# Patient Record
Sex: Female | Born: 1965 | Race: White | Hispanic: No | Marital: Single | Smoking: Former smoker
Health system: Southern US, Community
[De-identification: ages and names within clinical notes are randomized; demographics above are authoritative.]

## PROBLEM LIST (undated history)

## (undated) DIAGNOSIS — H9313 Tinnitus, bilateral: Secondary | ICD-10-CM

## (undated) DIAGNOSIS — M545 Low back pain, unspecified: Secondary | ICD-10-CM

## (undated) DIAGNOSIS — G8929 Other chronic pain: Secondary | ICD-10-CM

## (undated) DIAGNOSIS — R519 Headache, unspecified: Secondary | ICD-10-CM

## (undated) DIAGNOSIS — I499 Cardiac arrhythmia, unspecified: Secondary | ICD-10-CM

## (undated) DIAGNOSIS — T4145XA Adverse effect of unspecified anesthetic, initial encounter: Secondary | ICD-10-CM

## (undated) DIAGNOSIS — R51 Headache: Secondary | ICD-10-CM

## (undated) DIAGNOSIS — T8859XA Other complications of anesthesia, initial encounter: Secondary | ICD-10-CM

## (undated) DIAGNOSIS — G709 Myoneural disorder, unspecified: Secondary | ICD-10-CM

## (undated) DIAGNOSIS — N281 Cyst of kidney, acquired: Secondary | ICD-10-CM

## (undated) DIAGNOSIS — G039 Meningitis, unspecified: Secondary | ICD-10-CM

## (undated) HISTORY — PX: INGUINAL HERNIA REPAIR: SUR1180

## (undated) HISTORY — PX: ABDOMINAL HYSTERECTOMY: SHX81

## (undated) HISTORY — PX: EXCISIONAL HEMORRHOIDECTOMY: SHX1541

## (undated) HISTORY — PX: BACK SURGERY: SHX140

---

## 2010-03-14 HISTORY — PX: LUMBAR DISC SURGERY: SHX700

## 2010-03-14 HISTORY — PX: GANGLION CYST EXCISION: SHX1691

## 2010-03-14 HISTORY — PX: CARPAL TUNNEL RELEASE: SHX101

## 2012-05-14 ENCOUNTER — Other Ambulatory Visit: Payer: Self-pay | Admitting: Neurological Surgery

## 2012-05-14 ENCOUNTER — Other Ambulatory Visit (HOSPITAL_COMMUNITY): Payer: Self-pay | Admitting: Neurological Surgery

## 2012-05-14 DIAGNOSIS — M47817 Spondylosis without myelopathy or radiculopathy, lumbosacral region: Secondary | ICD-10-CM

## 2012-06-07 ENCOUNTER — Inpatient Hospital Stay (HOSPITAL_COMMUNITY): Admission: RE | Admit: 2012-06-07 | Payer: Self-pay | Source: Ambulatory Visit

## 2012-06-07 ENCOUNTER — Ambulatory Visit (HOSPITAL_COMMUNITY): Admission: RE | Admit: 2012-06-07 | Payer: PRIVATE HEALTH INSURANCE | Source: Ambulatory Visit

## 2012-06-28 ENCOUNTER — Ambulatory Visit (HOSPITAL_COMMUNITY): Payer: PRIVATE HEALTH INSURANCE

## 2012-06-28 ENCOUNTER — Other Ambulatory Visit (HOSPITAL_COMMUNITY): Payer: PRIVATE HEALTH INSURANCE

## 2014-08-25 ENCOUNTER — Other Ambulatory Visit: Payer: Self-pay | Admitting: Neurological Surgery

## 2014-09-29 ENCOUNTER — Encounter (HOSPITAL_COMMUNITY)
Admission: RE | Admit: 2014-09-29 | Discharge: 2014-09-29 | Disposition: A | Discharge status: Home / Self Care | Payer: Managed Care, Other (non HMO) | Source: Ambulatory Visit | Attending: Neurological Surgery | Admitting: Neurological Surgery

## 2014-09-29 ENCOUNTER — Encounter (HOSPITAL_COMMUNITY): Payer: Self-pay

## 2014-09-29 ENCOUNTER — Encounter (HOSPITAL_COMMUNITY): Payer: Self-pay | Admitting: Anesthesiology

## 2014-09-29 HISTORY — DX: Meningitis, unspecified: G03.9

## 2014-09-29 HISTORY — DX: Myoneural disorder, unspecified: G70.9

## 2014-09-29 HISTORY — DX: Tinnitus, bilateral: H93.13

## 2014-09-29 HISTORY — DX: Adverse effect of unspecified anesthetic, initial encounter: T41.45XA

## 2014-09-29 HISTORY — DX: Cardiac arrhythmia, unspecified: I49.9

## 2014-09-29 HISTORY — DX: Cyst of kidney, acquired: N28.1

## 2014-09-29 HISTORY — DX: Headache, unspecified: R51.9

## 2014-09-29 HISTORY — DX: Headache: R51

## 2014-09-29 HISTORY — DX: Other complications of anesthesia, initial encounter: T88.59XA

## 2014-09-29 LAB — ABO/RH: ABO/RH(D): AB POS

## 2014-09-29 LAB — BASIC METABOLIC PANEL
Anion gap: 11 (ref 5–15)
BUN: 19 mg/dL (ref 6–20)
CALCIUM: 8.9 mg/dL (ref 8.9–10.3)
CO2: 21 mmol/L — ABNORMAL LOW (ref 22–32)
Chloride: 106 mmol/L (ref 101–111)
Creatinine, Ser: 0.9 mg/dL (ref 0.44–1.00)
GFR calc non Af Amer: >60 mL/min (ref >60)
GLUCOSE: 98 mg/dL (ref 65–99)
Potassium: 3.6 mmol/L (ref 3.5–5.1)
Sodium: 138 mmol/L (ref 135–145)

## 2014-09-29 LAB — CBC
HEMATOCRIT: 38.2 % (ref 36.0–46.0)
Hemoglobin: 12.9 g/dL (ref 12.0–15.0)
MCH: 29.7 pg (ref 26.0–34.0)
MCHC: 33.8 g/dL (ref 30.0–36.0)
MCV: 88 fL (ref 78.0–100.0)
PLATELETS: 205 10*3/uL (ref 150–400)
RBC: 4.34 MIL/uL (ref 3.87–5.11)
RDW: 12.4 % (ref 11.5–15.5)
WBC: 8.5 10*3/uL (ref 4.0–10.5)

## 2014-09-29 LAB — SURGICAL PCR SCREEN
MRSA, PCR: NEGATIVE
STAPHYLOCOCCUS AUREUS: POSITIVE — AB

## 2014-09-29 LAB — TYPE AND SCREEN
ABO/RH(D): AB POS
Antibody Screen: NEGATIVE

## 2014-09-29 MED ORDER — CEFAZOLIN SODIUM-DEXTROSE 2-3 GM-% IV SOLR
2.0000 g | INTRAVENOUS | Status: AC
  Administered 2014-09-30 (×2): 2 g via INTRAVENOUS
  Filled 2014-09-29: qty 50

## 2014-09-29 NOTE — Progress Notes (Signed)
REQUESTED STRESS ECHO AND OFFICE NOTE FROM [facility].

## 2014-09-29 NOTE — Pre-Procedure Instructions (Signed)
[facility] [place] [facility]  09/29/2014      [name] [name] [facility] [address] Deborah Bates, Deborah Bates - [address] AT [facility] [address] Deborah Bates [place] [address] Phone: [phone] Fax: [phone]    Your procedure is scheduled on   Tuesday  09/30/14  Report to [facility] Admitting at  715 A.M.  Call this number if you have problems the morning of Bates:  [phone]   Remember:  Do not eat food or drink liquids after midnight.  Take these medicines the morning of Bates with A SIP OF WATER  CLONAZEPAM, DULOXETINE (CYMBALTA), OXYCODONE OR TRAMADOL IF NEEDED INHALER, TOPAMAX,    Do not wear jewelry, make-up or nail polish.  Do not wear lotions, powders, or perfumes.  You may wear deodorant.  Do not shave 48 hours prior to Bates.  Men may shave face and neck.  Do not bring valuables to the hospital.  [facility] is not responsible for any belongings or valuables.  Contacts, dentures or bridgework may not be worn into Bates.  Leave your suitcase in the car.  After Bates it may be brought to your room.  For patients admitted to the hospital, discharge time will be determined by your treatment team.  Patients discharged the day of Bates will not be allowed to drive home.   Name and phone number of your driver:    Special instructions:  [facility]  Before Bates, you can play an important role.  Because skin is not sterile, your skin needs to be as free of germs as possible.  You can reduce the number of germs on you skin by washing with CHG (chlorahexidine gluconate) soap before Bates.  CHG is an antiseptic cleaner which kills germs and bonds with the skin to continue killing germs even after washing.  Please DO NOT use if you have an allergy to CHG or antibacterial soaps.  If your skin becomes reddened/irritated stop using the CHG and inform your nurse when you arrive at Short Stay.  Do not shave  (including legs and underarms) for at least 48 hours prior to the first CHG shower.  You may shave your face.  Please follow these instructions carefully:   1.  Shower with CHG Soap the night before Bates and the                                morning of Bates.  2.  If you choose to wash your hair, wash your hair first as usual with your       normal shampoo.  3.  After you shampoo, rinse your hair and body thoroughly to remove the                      Shampoo.  4.  Use CHG as you would any other liquid soap.  You can apply chg directly       to the skin and wash gently with scrungie or a clean washcloth.  5.  Apply the CHG Soap to your body ONLY FROM THE NECK DOWN.        Do not use on open wounds or open sores.  Avoid contact with your eyes,       ears, mouth and genitals (private parts).  Wash genitals (private parts)       with your normal soap.  6.  Wash thoroughly, paying special attention to the area where your Bates        will be performed.  7.  Thoroughly rinse your body with warm water from the neck down.  8.  DO NOT shower/wash with your normal soap after using and rinsing off       the CHG Soap.  9.  Pat yourself dry with a clean towel.            10.  Wear clean pajamas.            11.  Place clean sheets on your bed the night of your first shower and do not        sleep with pets.  Day of Surgery  Do not apply any lotions/deoderants the morning of Bates.  Please wear clean clothes to the hospital/Bates Bates.     Please read over the following fact sheets that you were given. Pain Booklet, Coughing and Deep Breathing, Blood Transfusion Information, MRSA Information and Surgical Site Infection Prevention

## 2014-09-30 ENCOUNTER — Inpatient Hospital Stay (HOSPITAL_COMMUNITY): Payer: Managed Care, Other (non HMO)

## 2014-09-30 ENCOUNTER — Inpatient Hospital Stay (HOSPITAL_COMMUNITY)
Admission: RE | Admit: 2014-09-30 | Discharge: 2014-10-02 | DRG: 460 | Disposition: A | Discharge status: Home / Self Care | Payer: Managed Care, Other (non HMO) | Source: Ambulatory Visit | Attending: Neurological Surgery | Admitting: Neurological Surgery

## 2014-09-30 ENCOUNTER — Encounter (HOSPITAL_COMMUNITY)
Admission: RE | Disposition: A | Discharge status: Home / Self Care | Payer: PRIVATE HEALTH INSURANCE | Source: Ambulatory Visit | Attending: Neurological Surgery

## 2014-09-30 ENCOUNTER — Inpatient Hospital Stay (HOSPITAL_COMMUNITY): Payer: Managed Care, Other (non HMO) | Admitting: Anesthesiology

## 2014-09-30 ENCOUNTER — Encounter (HOSPITAL_COMMUNITY): Payer: Self-pay | Admitting: *Deleted

## 2014-09-30 DIAGNOSIS — M4726 Other spondylosis with radiculopathy, lumbar region: Secondary | ICD-10-CM | POA: Diagnosis present

## 2014-09-30 DIAGNOSIS — Z87891 Personal history of nicotine dependence: Secondary | ICD-10-CM

## 2014-09-30 DIAGNOSIS — M4806 Spinal stenosis, lumbar region: Secondary | ICD-10-CM | POA: Diagnosis present

## 2014-09-30 DIAGNOSIS — M48061 Spinal stenosis, lumbar region without neurogenic claudication: Secondary | ICD-10-CM | POA: Diagnosis present

## 2014-09-30 DIAGNOSIS — M4180 Other forms of scoliosis, site unspecified: Secondary | ICD-10-CM | POA: Diagnosis present

## 2014-09-30 DIAGNOSIS — Z419 Encounter for procedure for purposes other than remedying health state, unspecified: Secondary | ICD-10-CM

## 2014-09-30 DIAGNOSIS — Z01812 Encounter for preprocedural laboratory examination: Secondary | ICD-10-CM | POA: Diagnosis not present

## 2014-09-30 DIAGNOSIS — R51 Headache: Secondary | ICD-10-CM | POA: Diagnosis present

## 2014-09-30 DIAGNOSIS — Z885 Allergy status to narcotic agent status: Secondary | ICD-10-CM | POA: Diagnosis not present

## 2014-09-30 DIAGNOSIS — Z79899 Other long term (current) drug therapy: Secondary | ICD-10-CM

## 2014-09-30 HISTORY — DX: Low back pain: M54.5

## 2014-09-30 HISTORY — DX: Other chronic pain: G89.29

## 2014-09-30 HISTORY — DX: Low back pain, unspecified: M54.50

## 2014-09-30 HISTORY — PX: POSTERIOR LUMBAR FUSION: SHX6036

## 2014-09-30 SURGERY — POSTERIOR LUMBAR FUSION 2 LEVEL
Anesthesia: General | Site: Back

## 2014-09-30 MED ORDER — METHOCARBAMOL 500 MG PO TABS
500.0000 mg | ORAL_TABLET | Freq: Four times a day (QID) | ORAL | Status: DC | PRN
  Filled 2014-09-30: qty 1

## 2014-09-30 MED ORDER — TOPIRAMATE 25 MG PO TABS
50.0000 mg | ORAL_TABLET | Freq: Two times a day (BID) | ORAL | Status: DC
  Administered 2014-09-30 – 2014-10-01 (×3): 50 mg via ORAL
  Filled 2014-09-30 (×3): qty 2

## 2014-09-30 MED ORDER — ONDANSETRON HCL 4 MG/2ML IJ SOLN
4.0000 mg | INTRAMUSCULAR | Status: DC | PRN
  Administered 2014-10-01: 4 mg via INTRAVENOUS
  Filled 2014-09-30: qty 2

## 2014-09-30 MED ORDER — PHENOL 1.4 % MT LIQD: 1.0000 | OROMUCOSAL | Status: DC | PRN

## 2014-09-30 MED ORDER — ALBUTEROL SULFATE (2.5 MG/3ML) 0.083% IN NEBU: 2.5000 mg | INHALATION_SOLUTION | RESPIRATORY_TRACT | Status: DC | PRN

## 2014-09-30 MED ORDER — PROMETHAZINE HCL 25 MG/ML IJ SOLN: 6.2500 mg | INTRAMUSCULAR | Status: DC | PRN

## 2014-09-30 MED ORDER — SODIUM CHLORIDE 0.9 % IV SOLN
INTRAVENOUS | Status: DC
  Administered 2014-10-01 – 2014-10-02 (×3): via INTRAVENOUS

## 2014-09-30 MED ORDER — OXYCODONE-ACETAMINOPHEN 5-325 MG PO TABS
1.0000 | ORAL_TABLET | ORAL | Status: DC | PRN
  Administered 2014-10-02: 1 via ORAL
  Filled 2014-09-30: qty 1

## 2014-09-30 MED ORDER — TRAMADOL HCL 50 MG PO TABS: 50.0000 mg | ORAL_TABLET | Freq: Four times a day (QID) | ORAL | Status: DC

## 2014-09-30 MED ORDER — KETAMINE HCL 10 MG/ML IJ SOLN
INTRAMUSCULAR | Status: DC | PRN
  Administered 2014-09-30: 35 mg via INTRAVENOUS

## 2014-09-30 MED ORDER — DEXAMETHASONE 2 MG PO TABS
2.0000 mg | ORAL_TABLET | Freq: Two times a day (BID) | ORAL | Status: DC
  Filled 2014-09-30 (×3): qty 1

## 2014-09-30 MED ORDER — ARTIFICIAL TEARS OP OINT
TOPICAL_OINTMENT | OPHTHALMIC | Status: DC | PRN
  Administered 2014-09-30: 1 via OPHTHALMIC

## 2014-09-30 MED ORDER — CYCLOBENZAPRINE HCL 10 MG PO TABS
10.0000 mg | ORAL_TABLET | Freq: Three times a day (TID) | ORAL | Status: DC | PRN
  Administered 2014-09-30 – 2014-10-02 (×5): 10 mg via ORAL
  Filled 2014-09-30 (×5): qty 1

## 2014-09-30 MED ORDER — SODIUM CHLORIDE 0.9 % IV SOLN
250.0000 mg | INTRAVENOUS | Status: DC | PRN
  Administered 2014-09-30: 1 ug/kg/min via INTRAVENOUS

## 2014-09-30 MED ORDER — LIDOCAINE-EPINEPHRINE 1 %-1:100000 IJ SOLN
INTRAMUSCULAR | Status: DC | PRN
  Administered 2014-09-30: 5 mL

## 2014-09-30 MED ORDER — LACTATED RINGERS IV SOLN
INTRAVENOUS | Status: DC
  Administered 2014-09-30 (×2): via INTRAVENOUS

## 2014-09-30 MED ORDER — ACETAMINOPHEN 325 MG PO TABS
650.0000 mg | ORAL_TABLET | ORAL | Status: DC | PRN
  Administered 2014-10-01 – 2014-10-02 (×2): 650 mg via ORAL
  Filled 2014-09-30 (×2): qty 2

## 2014-09-30 MED ORDER — BUPIVACAINE HCL (PF) 0.5 % IJ SOLN
INTRAMUSCULAR | Status: DC | PRN
  Administered 2014-09-30: 25 mL
  Administered 2014-09-30: 5 mL

## 2014-09-30 MED ORDER — TOPIRAMATE 25 MG PO TABS: 50.0000 mg | ORAL_TABLET | Freq: Three times a day (TID) | ORAL | Status: DC

## 2014-09-30 MED ORDER — SODIUM CHLORIDE 0.9 % IV SOLN: 250.0000 mL | INTRAVENOUS | Status: DC

## 2014-09-30 MED ORDER — FENTANYL CITRATE (PF) 100 MCG/2ML IJ SOLN
25.0000 ug | INTRAMUSCULAR | Status: DC | PRN
  Administered 2014-09-30 (×3): 50 ug via INTRAVENOUS

## 2014-09-30 MED ORDER — 0.9 % SODIUM CHLORIDE (POUR BTL) OPTIME
TOPICAL | Status: DC | PRN
  Administered 2014-09-30: 1000 mL

## 2014-09-30 MED ORDER — CEFAZOLIN SODIUM 1-5 GM-% IV SOLN
1.0000 g | Freq: Three times a day (TID) | INTRAVENOUS | Status: AC
  Administered 2014-09-30 – 2014-10-01 (×2): 1 g via INTRAVENOUS
  Filled 2014-09-30 (×2): qty 50

## 2014-09-30 MED ORDER — OXYCODONE HCL 5 MG PO TABS
15.0000 mg | ORAL_TABLET | ORAL | Status: DC | PRN
  Administered 2014-09-30 – 2014-10-02 (×8): 15 mg via ORAL
  Filled 2014-09-30 (×8): qty 3

## 2014-09-30 MED ORDER — ROCURONIUM BROMIDE 100 MG/10ML IV SOLN
INTRAVENOUS | Status: DC | PRN
  Administered 2014-09-30: 50 mg via INTRAVENOUS

## 2014-09-30 MED ORDER — SODIUM CHLORIDE 0.9 % IR SOLN
Status: DC | PRN
  Administered 2014-09-30: 500 mL

## 2014-09-30 MED ORDER — FENTANYL CITRATE (PF) 100 MCG/2ML IJ SOLN
INTRAMUSCULAR | Status: DC | PRN
  Administered 2014-09-30: 50 ug via INTRAVENOUS
  Administered 2014-09-30: 150 ug via INTRAVENOUS
  Administered 2014-09-30: 50 ug via INTRAVENOUS

## 2014-09-30 MED ORDER — CLONAZEPAM 0.5 MG PO TABS
0.5000 mg | ORAL_TABLET | Freq: Two times a day (BID) | ORAL | Status: DC
  Administered 2014-09-30 – 2014-10-01 (×3): 0.5 mg via ORAL
  Filled 2014-09-30 (×3): qty 1

## 2014-09-30 MED ORDER — FENTANYL CITRATE (PF) 100 MCG/2ML IJ SOLN
INTRAMUSCULAR | Status: AC
  Filled 2014-09-30: qty 2

## 2014-09-30 MED ORDER — PROPOFOL 10 MG/ML IV BOLUS
INTRAVENOUS | Status: AC
  Filled 2014-09-30: qty 20

## 2014-09-30 MED ORDER — DULOXETINE HCL 30 MG PO CPEP
30.0000 mg | ORAL_CAPSULE | Freq: Every morning | ORAL | Status: DC
  Administered 2014-10-01: 30 mg via ORAL
  Filled 2014-09-30: qty 1

## 2014-09-30 MED ORDER — NEOSTIGMINE METHYLSULFATE 10 MG/10ML IV SOLN
INTRAVENOUS | Status: DC | PRN
  Administered 2014-09-30: 4 mg via INTRAVENOUS

## 2014-09-30 MED ORDER — LACTATED RINGERS IV SOLN
INTRAVENOUS | Status: DC | PRN
  Administered 2014-09-30 (×3): via INTRAVENOUS

## 2014-09-30 MED ORDER — MIDAZOLAM HCL 5 MG/5ML IJ SOLN
INTRAMUSCULAR | Status: DC | PRN
  Administered 2014-09-30: 2 mg via INTRAVENOUS

## 2014-09-30 MED ORDER — MUPIROCIN 2 % EX OINT
1.0000 "application " | TOPICAL_OINTMENT | Freq: Once | CUTANEOUS | Status: AC
  Administered 2014-09-30: 1 via TOPICAL

## 2014-09-30 MED ORDER — FENTANYL CITRATE (PF) 250 MCG/5ML IJ SOLN
INTRAMUSCULAR | Status: AC
  Filled 2014-09-30: qty 5

## 2014-09-30 MED ORDER — KETOROLAC TROMETHAMINE 15 MG/ML IJ SOLN
INTRAMUSCULAR | Status: AC
  Filled 2014-09-30: qty 1

## 2014-09-30 MED ORDER — LIDOCAINE HCL (CARDIAC) 20 MG/ML IV SOLN
INTRAVENOUS | Status: DC | PRN
  Administered 2014-09-30: 50 mg via INTRAVENOUS

## 2014-09-30 MED ORDER — KETOROLAC TROMETHAMINE 15 MG/ML IJ SOLN
15.0000 mg | Freq: Four times a day (QID) | INTRAMUSCULAR | Status: AC
  Administered 2014-09-30 – 2014-10-01 (×5): 15 mg via INTRAVENOUS
  Filled 2014-09-30 (×4): qty 1

## 2014-09-30 MED ORDER — SODIUM CHLORIDE 0.9 % IJ SOLN: 3.0000 mL | INTRAMUSCULAR | Status: DC | PRN

## 2014-09-30 MED ORDER — SUGAMMADEX SODIUM 200 MG/2ML IV SOLN
INTRAVENOUS | Status: AC
  Filled 2014-09-30: qty 2

## 2014-09-30 MED ORDER — MENTHOL 3 MG MT LOZG: 1.0000 | LOZENGE | OROMUCOSAL | Status: DC | PRN

## 2014-09-30 MED ORDER — THROMBIN 20000 UNITS EX SOLR
CUTANEOUS | Status: DC | PRN
  Administered 2014-09-30: 20 mL via TOPICAL

## 2014-09-30 MED ORDER — MUPIROCIN 2 % EX OINT
TOPICAL_OINTMENT | CUTANEOUS | Status: AC
  Filled 2014-09-30: qty 22

## 2014-09-30 MED ORDER — BISACODYL 10 MG RE SUPP
10.0000 mg | Freq: Every day | RECTAL | Status: DC | PRN
  Administered 2014-10-01: 10 mg via RECTAL
  Filled 2014-09-30: qty 1

## 2014-09-30 MED ORDER — SODIUM CHLORIDE 0.9 % IJ SOLN
3.0000 mL | Freq: Two times a day (BID) | INTRAMUSCULAR | Status: DC
  Administered 2014-09-30: 3 mL via INTRAVENOUS

## 2014-09-30 MED ORDER — PROPOFOL 10 MG/ML IV BOLUS
INTRAVENOUS | Status: DC | PRN
  Administered 2014-09-30: 150 mg via INTRAVENOUS

## 2014-09-30 MED ORDER — ALBUTEROL SULFATE HFA 108 (90 BASE) MCG/ACT IN AERS: 2.0000 | INHALATION_SPRAY | RESPIRATORY_TRACT | Status: DC | PRN

## 2014-09-30 MED ORDER — TOPIRAMATE 100 MG PO TABS
100.0000 mg | ORAL_TABLET | Freq: Every day | ORAL | Status: DC
  Administered 2014-09-30 – 2014-10-01 (×2): 100 mg via ORAL
  Filled 2014-09-30 (×2): qty 1

## 2014-09-30 MED ORDER — ONDANSETRON HCL 4 MG/2ML IJ SOLN
INTRAMUSCULAR | Status: DC | PRN
  Administered 2014-09-30: 4 mg via INTRAVENOUS

## 2014-09-30 MED ORDER — ALUM & MAG HYDROXIDE-SIMETH 200-200-20 MG/5ML PO SUSP
30.0000 mL | Freq: Four times a day (QID) | ORAL | Status: DC | PRN
Start: 2014-09-30 — End: 2014-10-02

## 2014-09-30 MED ORDER — SENNA 8.6 MG PO TABS
1.0000 | ORAL_TABLET | Freq: Two times a day (BID) | ORAL | Status: DC
  Administered 2014-09-30 – 2014-10-01 (×3): 8.6 mg via ORAL
  Filled 2014-09-30 (×3): qty 1

## 2014-09-30 MED ORDER — KETAMINE HCL 100 MG/ML IJ SOLN
INTRAMUSCULAR | Status: AC
  Filled 2014-09-30: qty 1

## 2014-09-30 MED ORDER — METHOCARBAMOL 1000 MG/10ML IJ SOLN
500.0000 mg | Freq: Four times a day (QID) | INTRAVENOUS | Status: DC | PRN
  Administered 2014-09-30: 500 mg via INTRAVENOUS
  Filled 2014-09-30 (×2): qty 5

## 2014-09-30 MED ORDER — FLEET ENEMA 7-19 GM/118ML RE ENEM: 1.0000 | ENEMA | Freq: Once | RECTAL | Status: AC | PRN

## 2014-09-30 MED ORDER — GLYCOPYRROLATE 0.2 MG/ML IJ SOLN
INTRAMUSCULAR | Status: DC | PRN
  Administered 2014-09-30: .7 mg via INTRAVENOUS

## 2014-09-30 MED ORDER — ACETAMINOPHEN 650 MG RE SUPP: 650.0000 mg | RECTAL | Status: DC | PRN

## 2014-09-30 MED ORDER — PHENYLEPHRINE HCL 10 MG/ML IJ SOLN
INTRAMUSCULAR | Status: DC | PRN
  Administered 2014-09-30: 80 ug via INTRAVENOUS

## 2014-09-30 MED ORDER — ZOLPIDEM TARTRATE 5 MG PO TABS
5.0000 mg | ORAL_TABLET | Freq: Every evening | ORAL | Status: DC | PRN
  Administered 2014-09-30 – 2014-10-01 (×2): 5 mg via ORAL
  Filled 2014-09-30 (×2): qty 1

## 2014-09-30 MED ORDER — MIRTAZAPINE 15 MG PO TABS
15.0000 mg | ORAL_TABLET | Freq: Every day | ORAL | Status: DC
  Administered 2014-09-30 – 2014-10-01 (×2): 15 mg via ORAL
  Filled 2014-09-30 (×2): qty 1

## 2014-09-30 MED ORDER — MIDAZOLAM HCL 2 MG/2ML IJ SOLN
INTRAMUSCULAR | Status: AC
  Filled 2014-09-30: qty 2

## 2014-09-30 MED ORDER — DOCUSATE SODIUM 100 MG PO CAPS
100.0000 mg | ORAL_CAPSULE | Freq: Two times a day (BID) | ORAL | Status: DC
  Administered 2014-09-30 – 2014-10-01 (×3): 100 mg via ORAL
  Filled 2014-09-30 (×3): qty 1

## 2014-09-30 MED ORDER — POLYETHYLENE GLYCOL 3350 17 G PO PACK
17.0000 g | PACK | Freq: Every day | ORAL | Status: DC | PRN
  Administered 2014-10-01: 17 g via ORAL
  Filled 2014-09-30: qty 1

## 2014-09-30 MED ORDER — VECURONIUM BROMIDE 10 MG IV SOLR
INTRAVENOUS | Status: DC | PRN
  Administered 2014-09-30 (×3): 2 mg via INTRAVENOUS

## 2014-09-30 SURGICAL SUPPLY — 60 items
BAG DECANTER FOR FLEXI CONT (MISCELLANEOUS) ×2 IMPLANT
BLADE CLIPPER SURG (BLADE) IMPLANT
BONE MATRIX OSTEOCEL PRO MED (Bone Implant) ×4 IMPLANT
BUR MATCHSTICK NEURO 3.0 LAGG (BURR) ×2 IMPLANT
CAGE COROENT MP 8X9X23M-8 SPIN (Cage) ×4 IMPLANT
CAGE COROENT PLIF 10X28-8 LUMB (Cage) ×4 IMPLANT
CANISTER SUCT 3000ML PPV (MISCELLANEOUS) ×2 IMPLANT
CONT SPEC 4OZ CLIKSEAL STRL BL (MISCELLANEOUS) ×4 IMPLANT
COVER BACK TABLE 60X90IN (DRAPES) ×2 IMPLANT
DECANTER SPIKE VIAL GLASS SM (MISCELLANEOUS) ×2 IMPLANT
DERMABOND ADVANCED (GAUZE/BANDAGES/DRESSINGS) ×1
DERMABOND ADVANCED .7 DNX12 (GAUZE/BANDAGES/DRESSINGS) ×1 IMPLANT
DRAPE C-ARM 42X72 X-RAY (DRAPES) ×4 IMPLANT
DRAPE LAPAROTOMY 100X72X124 (DRAPES) ×2 IMPLANT
DRAPE POUCH INSTRU U-SHP 10X18 (DRAPES) ×2 IMPLANT
DRAPE PROXIMA HALF (DRAPES) IMPLANT
DRSG OPSITE POSTOP 4X6 (GAUZE/BANDAGES/DRESSINGS) ×2 IMPLANT
DURAPREP 26ML APPLICATOR (WOUND CARE) ×2 IMPLANT
ELECT REM PT RETURN 9FT ADLT (ELECTROSURGICAL) ×2
ELECTRODE REM PT RTRN 9FT ADLT (ELECTROSURGICAL) ×1 IMPLANT
GAUZE SPONGE 4X4 12PLY STRL (GAUZE/BANDAGES/DRESSINGS) IMPLANT
GAUZE SPONGE 4X4 16PLY XRAY LF (GAUZE/BANDAGES/DRESSINGS) ×2 IMPLANT
GLOVE BIO SURGEON STRL SZ7.5 (GLOVE) ×4 IMPLANT
GLOVE BIOGEL PI IND STRL 7.5 (GLOVE) ×2 IMPLANT
GLOVE BIOGEL PI IND STRL 8.5 (GLOVE) ×2 IMPLANT
GLOVE BIOGEL PI INDICATOR 7.5 (GLOVE) ×2
GLOVE BIOGEL PI INDICATOR 8.5 (GLOVE) ×2
GLOVE ECLIPSE 8.5 STRL (GLOVE) ×4 IMPLANT
GOWN STRL REUS W/ TWL LRG LVL3 (GOWN DISPOSABLE) ×1 IMPLANT
GOWN STRL REUS W/ TWL XL LVL3 (GOWN DISPOSABLE) ×2 IMPLANT
GOWN STRL REUS W/TWL 2XL LVL3 (GOWN DISPOSABLE) ×2 IMPLANT
GOWN STRL REUS W/TWL LRG LVL3 (GOWN DISPOSABLE) ×1
GOWN STRL REUS W/TWL XL LVL3 (GOWN DISPOSABLE) ×2
HEMOSTAT POWDER KIT SURGIFOAM (HEMOSTASIS) IMPLANT
KIT BASIN OR (CUSTOM PROCEDURE TRAY) ×2 IMPLANT
KIT ROOM TURNOVER OR (KITS) ×2 IMPLANT
MILL MEDIUM DISP (BLADE) ×2 IMPLANT
NEEDLE HYPO 22GX1.5 SAFETY (NEEDLE) ×2 IMPLANT
NEEDLE SPNL 18GX3.5 QUINCKE PK (NEEDLE) IMPLANT
NS IRRIG 1000ML POUR BTL (IV SOLUTION) ×2 IMPLANT
PACK LAMINECTOMY NEURO (CUSTOM PROCEDURE TRAY) ×2 IMPLANT
PAD ARMBOARD 7.5X6 YLW CONV (MISCELLANEOUS) ×6 IMPLANT
PATTIES SURGICAL .5 X1 (DISPOSABLE) IMPLANT
ROD RELIN-O LORD 5.5X65MM (Rod) ×4 IMPLANT
SCREW LOCK RELINE 5.5 TULIP (Screw) ×12 IMPLANT
SCREW RELINE-O POLY 6.5X45 (Screw) ×12 IMPLANT
SPONGE LAP 4X18 X RAY DECT (DISPOSABLE) IMPLANT
SPONGE SURGIFOAM ABS GEL 100 (HEMOSTASIS) ×2 IMPLANT
SUT VIC AB 1 CT1 18XBRD ANBCTR (SUTURE) ×2 IMPLANT
SUT VIC AB 1 CT1 8-18 (SUTURE) ×2
SUT VIC AB 2-0 CP2 18 (SUTURE) ×4 IMPLANT
SUT VIC AB 3-0 SH 8-18 (SUTURE) ×4 IMPLANT
SYR 20ML ECCENTRIC (SYRINGE) ×2 IMPLANT
SYR 3ML LL SCALE MARK (SYRINGE) ×8 IMPLANT
SYR 5ML LL (SYRINGE) IMPLANT
TOWEL OR 17X24 6PK STRL BLUE (TOWEL DISPOSABLE) ×2 IMPLANT
TOWEL OR 17X26 10 PK STRL BLUE (TOWEL DISPOSABLE) ×2 IMPLANT
TRAP SPECIMEN MUCOUS 40CC (MISCELLANEOUS) ×2 IMPLANT
TRAY FOLEY W/METER SILVER 14FR (SET/KITS/TRAYS/PACK) ×2 IMPLANT
WATER STERILE IRR 1000ML POUR (IV SOLUTION) ×2 IMPLANT

## 2014-09-30 NOTE — Transfer of Care (Signed)
Immediate Anesthesia Transfer of Care Note  Patient: Deborah Bates  Procedure(s) Performed: Procedure(s) with comments: L3-4 L4-5 Posterior lumbar interbody fusion (N/A) - L3-4 L4-5 Posterior lumbar interbody fusion  Patient Location: PACU  Anesthesia Type:General  Level of Consciousness: awake, alert  and oriented  Airway & Oxygen Therapy: Patient Spontanous Breathing and Patient connected to nasal cannula oxygen  Post-op Assessment: Report given to RN and Post -op Vital signs reviewed and stable  Post vital signs: Reviewed and stable  Last Vitals:  Filed Vitals:   09/30/14 1446  BP:   Pulse:   Temp: 36.5 C  Resp:     Complications: No apparent anesthesia complications

## 2014-09-30 NOTE — Progress Notes (Signed)
Nothing received as of 0800  09-30-2014. From [facility] (Echo Stress)

## 2014-09-30 NOTE — H&P (Signed)
Deborah Bates is an 49 y.o. female.   Chief Complaint: Back and bilateral leg pain HPI: Deborah Bates is a 49 year old female who has had previous problems with back pain and lumbar radiculopathy. 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 She notes that she has been unable to control her pain with changes in position alteration of activity. 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  Past Medical History  Diagnosis Date  . Complication of anesthesia     TROUBLE COMING OUT OF IT   . Dysrhythmia     TACHYCARDIA   . Headache   . Tinnitus of both ears   . Neuromuscular disorder     NUMBNESS TINGLING HAND/FEET  . Cyst of kidney, acquired   . Meningitis     1984    Past Surgical History  Procedure Laterality Date  . Hemorroidectomy    . Abdominal hysterectomy    . Back surgery      2012   LUMBAR  DEC    . Carpal tunnel release      2012  . Hernia repair      BIL HERNIA   AS INFANT    History reviewed. No pertinent family history. Social History:  reports that she quit smoking 5 days ago. She does not have any smokeless tobacco history on file. She reports that she drinks alcohol. She reports that she does not use illicit drugs.  Allergies:  Allergies  Allergen Reactions  . Dilaudid [Hydromorphone Hcl]     MAKES ME CRAZY    I CLAW MY SKIN OFF   . Morphine And Related Other (See Comments)    Pt states it "makes me crazy and makes me feel like I want to scratch my skin off"  . Vicodin [Hydrocodone-Acetaminophen] Rash    Medications Prior to Admission   Medication Sig Dispense Refill  . clonazePAM (KLONOPIN) 0.5 MG tablet Take 0.5 mg by mouth 2 (two) times daily.  1  . cyclobenzaprine (FLEXERIL) 10 MG tablet Take 10 mg by mouth 3 (three) times daily as needed for muscle spasms.   2  . DULoxetine (CYMBALTA) 30 MG capsule Take 30 mg by mouth every morning.  1  . eszopiclone (LUNESTA) 2 MG TABS tablet Take 2 mg by mouth at bedtime.  3  . mirtazapine (REMERON) 15 MG tablet Take 15 mg by mouth at bedtime.  2  . oxyCODONE (ROXICODONE) 15 MG immediate release tablet Take 7.5 mg by mouth daily as needed for pain.   0  . topiramate (TOPAMAX) 100 MG tablet Take 50-100 mg by mouth 3 (three) times daily. 50 mg twice daily and 100 mg at bedtime  4  . valACYclovir (VALTREX) 1000 MG tablet Take 1 g by mouth as needed (cold sores).   5  . varenicline (CHANTIX) 1 MG tablet Take 1 mg by mouth 2 (two) times daily.    Marland Kitchen PROVENTIL HFA 108 (90 BASE) MCG/ACT inhaler Take 2 puffs by mouth every 4 (four) hours as needed for wheezing.   1  . traMADol (ULTRAM) 50 MG tablet Take 50 mg by mouth  4 (four) times daily.  2    Results for orders placed or performed during the hospital encounter of 09/29/14 (from the past 48 hour(s))  Type and screen     Status: None   Collection Time: 09/29/14  4:10 PM  Result Value Ref Range   ABO/RH(D) AB POS    Antibody Screen NEG    Sample Expiration 10/13/2014   ABO/Rh     Status: None   Collection Time: 09/29/14  4:10 PM  Result Value Ref Range   ABO/RH(D) AB POS   Surgical pcr screen     Status: Abnormal   Collection Time: 09/29/14  4:11 PM  Result Value Ref Range   MRSA, PCR NEGATIVE NEGATIVE   Staphylococcus aureus POSITIVE (A) NEGATIVE    Comment:        The Xpert SA Assay (FDA approved for NASAL specimens in patients over 32 years of age), is one component of a comprehensive surveillance program.  Test performance has been validated by [facility] for patients greater than or equal to 56 year old. It is not  intended to diagnose infection nor to guide or monitor treatment.   CBC     Status: None   Collection Time: 09/29/14  4:12 PM  Result Value Ref Range   WBC 8.5 4.0 - 10.5 K/uL   RBC 4.34 3.87 - 5.11 MIL/uL   Hemoglobin 12.9 12.0 - 15.0 g/dL   HCT 38.2 36.0 - 46.0 %   MCV 88.0 78.0 - 100.0 fL   MCH 29.7 26.0 - 34.0 pg   MCHC 33.8 30.0 - 36.0 g/dL   RDW 12.4 11.5 - 15.5 %   Platelets 205 150 - 400 K/uL  Basic metabolic panel     Status: Abnormal   Collection Time: 09/29/14  4:12 PM  Result Value Ref Range   Sodium 138 135 - 145 mmol/L   Potassium 3.6 3.5 - 5.1 mmol/L   Chloride 106 101 - 111 mmol/L   CO2 21 (L) 22 - 32 mmol/L   Glucose, Bld 98 65 - 99 mg/dL   BUN 19 6 - 20 mg/dL   Creatinine, Ser 0.90 0.44 - 1.00 mg/dL   Calcium 8.9 8.9 - 10.3 mg/dL   GFR calc non Af Amer >60 >60 mL/min   GFR calc Af Amer >60 >60 mL/min    Comment: (NOTE) The eGFR has been calculated using the CKD EPI equation. This calculation has not been validated in all clinical situations. eGFR's persistently <60 mL/min signify possible Chronic Kidney Disease.    Anion gap 11 5 - 15   No results found.  Review of Systems  HENT: Negative.   Eyes: Negative.   Respiratory: Negative.   Cardiovascular: Negative.   Gastrointestinal: Negative.   Genitourinary: Negative.   Musculoskeletal: Positive for back pain.  Skin: Negative.   Neurological: Positive for weakness.  Endo/Heme/Allergies: Negative.   Psychiatric/Behavioral: Negative.     Blood pressure 95/67, pulse 66, temperature 97.8 F (36.6 C), temperature source Oral, resp. rate 18, height _0  (1.676 m), weight 70.761 kg (156 lb), SpO2 100 %. Physical Exam  Constitutional: She appears well-developed and well-nourished.  HENT:  Head: Normocephalic and atraumatic.  Eyes: Conjunctivae and EOM are normal. Pupils are equal, round, and reactive to light.  Neck: Normal range of motion. Neck supple.  Cardiovascular: Normal rate and regular  rhythm.   Respiratory: Effort normal and breath sounds normal.  GI: Soft. Bowel sounds are normal.  Musculoskeletal:  Centralized low  back pain positive straight leg raising on the right and on the left at 15  Neurological:  Inial nerve examination is within limits of normal pupils are 3 mm briskly reactive light accommodation likely movements are full the neck is supple range of motion is good in lower extremities motor function reveals weakness in tibialis anterior in the quadriceps bilaterally absent reflexes are noted in the patellae and the Achilles both upper extremity reflexes are 1+ in the biceps and triceps 1+ brachioradialis. Sensation appears intact in the distal lower extremities.  Skin: Skin is warm and dry.  Psychiatric: She has a normal mood and affect. Her behavior is normal. Judgment and thought content normal.     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  Decompression fusion L3-4 L4-5 posterior lumbar interbody arthrodesis.  Zeinab Rodwell J 09/30/2014, 10:27 AM

## 2014-09-30 NOTE — Progress Notes (Signed)
Patient ID: Deborah DyerJessica M Bates, female   DOB: 15-Jan-1966, 49 y.o.   MRN: [id number] Vital signs are stable Motor function appears intact Dressings clean and dry Reasonably comfortable in regards to back pain and leg pain Stable postop

## 2014-09-30 NOTE — Anesthesia Postprocedure Evaluation (Signed)
  Anesthesia Post-op Note  Patient: Deborah Bates  Procedure(s) Performed: Procedure(s) (LRB): L3-4 L4-5 Posterior lumbar interbody fusion (N/A)  Patient Location: PACU  Anesthesia Type: General  Level of Consciousness: awake and alert   Airway and Oxygen Therapy: Patient Spontanous Breathing  Post-op Pain: mild  Post-op Assessment: Post-op Vital signs reviewed, Patient's Cardiovascular Status Stable, Respiratory Function Stable, Patent Airway and No signs of Nausea or vomiting  Last Vitals:  Filed Vitals:   09/30/14 1530  BP: 102/66  Pulse: 82  Temp:   Resp: 9    Post-op Vital Signs: stable   Complications: No apparent anesthesia complications

## 2014-09-30 NOTE — Anesthesia Preprocedure Evaluation (Signed)
Anesthesia Evaluation  Patient identified by MRN, date of birth, ID band Patient awake    Reviewed: Allergy & Precautions, NPO status , Patient's Chart, lab work & pertinent test results  Airway Mallampati: II  TM Distance: >3 FB Neck ROM: Full    Dental no notable dental hx.    Pulmonary neg pulmonary ROS, former smoker,  breath sounds clear to auscultation  Pulmonary exam normal       Cardiovascular negative cardio ROS Normal cardiovascular examRhythm:Regular Rate:Normal     Neuro/Psych negative neurological ROS  negative psych ROS   GI/Hepatic negative GI ROS, Neg liver ROS,   Endo/Other  negative endocrine ROS  Renal/GU negative Renal ROS  negative genitourinary   Musculoskeletal negative musculoskeletal ROS (+)   Abdominal   Peds negative pediatric ROS (+)  Hematology negative hematology ROS (+)   Anesthesia Other Findings   Reproductive/Obstetrics negative OB ROS                             Anesthesia Physical Anesthesia Plan  ASA: II  Anesthesia Plan: General   Post-op Pain Management:    Induction: Intravenous  Airway Management Planned: Oral ETT  Additional Equipment:   Intra-op Plan:   Post-operative Plan: Extubation in OR  Informed Consent: I have reviewed the patients History and Physical, chart, labs and discussed the procedure including the risks, benefits and alternatives for the proposed anesthesia with the patient or authorized representative who has indicated his/her understanding and acceptance.   Dental advisory given  Plan Discussed with: CRNA and Surgeon  Anesthesia Plan Comments:         Anesthesia Quick Evaluation

## 2014-09-30 NOTE — Op Note (Signed)
Date of surgery: 09/30/2014 Preoperative diagnosis: Herniated nucleus pulposus L3-L4 with stenosis and radiculopathy, spondylosis L4-L5 with radiculopathy and spondylolisthesis the right L4-5. Postoperative diagnosis: Herniated nucleus pulposus L3-4 with stenosis and radiculopathy. Spondylosis L4-5 with radiculopathy. Spondylolisthesis L4-5 right Procedure: Laminectomy L3-4 and L4-5 with complete removal of L4 laminar arch decompression of L3-L4 and L5 nerve roots with more work than require for simple interbody technique area posterior lumbar interbody fusion using peek spacers local autograft and allograft L3-4 L4-5. Posterior lateral arthrodesis L3-L5 with local autograft and allograft. Segmental fixation L3-L5 with pedicle screws.  Surgeon: Barnett Abu First assistant: Sharyon Cable M.D. Anesthesia: Gen. endotracheal Indications: The patient is a 49 year old individual who's had significant back and lower extremity pain worse on the right than on the left. She had marked degenerative changes at L4-5 and a previous disc herniation there she has developed some slight bit of scoliosis in addition now has a central disc herniation at L3-L4. She's been advised regarding the need for surgical decompression and stabilization from L3-L5.  Procedure: The patient was brought to the operating room supine on a stretcher. After the smooth induction of general endotracheal anesthesia, she was turned prone the back was prepped with alcohol and DuraPrep and draped in a sterile fashion. A midline incision was made in the lower lumbar spine overlying the L3-4 and L4-5 regions. Dissection was carried down to the lumbar dorsal fascia. Dissection was then carried out in a subperiosteal fashion to expose L3-4 and L4-5. The dissection was carried over the facet joints at L3-4 and L4-5 to expose the transverse processes of L3-L4 and L5. These areas were packed off for later use and bone grafting. They were decorticated.  Laminectomy at L3 was then completed by removing the inferior margin lamina L3 out to and including the entirety of the facet at L3-4. A complete laminectomy of L4 was then undertaken removing the entirety of the laminar arch and spinous process of L4 exposing the common dural tube. Then by carefully dissecting expose the L3 nerve root and decompressed it and its travel the foramen superiorly with a 2 and a 3 mm Kerrison punch. The L4 nerve root was then decompressed bilaterally with care being taken to protect its travel out the foramen also. On the right side the region of the L4 and L5 nerve roots were noted to be in a significant syncytium of mass secondary to previous discectomy on that side. Care was taken to decompress the L5 nerve roots bilaterally using the same tools and a high-speed drill. Once this was accomplished the disc spaces were isolated and then entered at L3-4 first bilaterally removing the entirety of the disc space using a combination of curettes and rongeurs. The endplates were then rongeured smoothed using a toothed curette. Once this was completed interspace was sized for appropriate size spacer was felt that a 10 mm tall 8 lordotic 23 mm long peek spacer would reduce the interspace and provide lordosis these were packed with autograft and allograft. Interspace was then packed with 6 mL of autograft and allograft after first one spacer was placed in the contralateral spacer was placed. Attention was then turned to L4-L5 where the interspace on the right side was noted to be severely collapsed and tight this causes the start of some modest scoliosis. The right side was then decompressed and the left side was opened and a significant quantity of severely degenerated and desiccated disc material was removed from within it. As the interspace was opened is  apparent that amount of distraction and could be achieved was last here pus an 8 mm tall 8 lordosis 23 mm long spacer was chosen was first  placed on the right side 6 mL of autograft and allograft was also placed into this interspace. Once this was completed pedicle entry sites were chosen under fluoroscopic guidance using 6.5 x 45 mm screws this holes were individually sounded tapped and then the screws placed before being checked for any evidence of cutout. 66.5 x 45 mm screws were used then 65 mm precontoured rods were used to connect the screws together in a neutral construct. Lateral gutters which had been previously packed away with and packed with the remainder of autograft measuring about 9 mL on each lateral gutter. Once this was accomplished care was taken to make sure that hemostasis was achieved in the epidural space and at each of the nerve roots L3 L4 L5 were well decompressed on encumbered by any debris and then the lumbar dorsal fascia was closed with #1 Vicryls in interrupted fashion 20 Vicryls used in the subcutaneous tissues and 30 Vicryls subcuticularly. Blood loss is estimated 3 and 50 mL.

## 2014-09-30 NOTE — Anesthesia Procedure Notes (Signed)
Procedure Name: Intubation Date/Time: 09/30/2014 10:39 AM Performed by: Eligha Bridegroom Pre-anesthesia Checklist: Patient identified, Timeout performed, Emergency Drugs available, Suction available and Patient being monitored Patient Re-evaluated:Patient Re-evaluated prior to inductionOxygen Delivery Method: Circle system utilized and Ambu bag Preoxygenation: Pre-oxygenation with 100% oxygen Intubation Type: IV induction Laryngoscope Size: Mac and 3 Grade View: Grade I Tube type: Oral Tube size: 7.0 mm Number of attempts: 1 Airway Equipment and Method: Stylet and LTA kit utilized Placement Confirmation: ETT inserted through vocal cords under direct vision,  breath sounds checked- equal and bilateral and positive ETCO2 Secured at: 21 cm Tube secured with: Tape Dental Injury: Teeth and Oropharynx as per pre-operative assessment

## 2014-10-01 MED FILL — Sodium Chloride IV Soln 0.9%: INTRAVENOUS | Qty: 1000 | Status: AC

## 2014-10-01 MED FILL — Heparin Sodium (Porcine) Inj 1000 Unit/ML: INTRAMUSCULAR | Qty: 30 | Status: AC

## 2014-10-01 NOTE — Progress Notes (Signed)
With each rounding, patient voices that " i just really want to poop". I offered suppository several times during today and patient refused. Husband at bedside. She is currently passing flatus and also ambulate hallways more than once today. Will continue to monitor.   Sim BoastHavy, RN

## 2014-10-01 NOTE — Addendum Note (Signed)
Addendum  created 10/01/14 1510 by Gwenyth Allegraichard Zonnie Landen, CRNA   Modules edited: Anesthesia Events, Narrator   Narrator:  Narrator: Event Log Edited

## 2014-10-01 NOTE — Evaluation (Signed)
Physical Therapy Evaluation Patient Details Name: Deborah Bates MRN: [id number] DOB: 10-15-1965 Today'Bates Date: 10/01/2014   History of Present Illness  49 yo female Bates/p PLIF L3-4 L4-5 PMH: tacycardia, tinnitus bil ears, neuromuscular disorder, miningitis, back surg 2012, carpal tunnel release, hernia repair  Clinical Impression  Patient is Bates/p above surgery presenting with functional limitations due to the deficits listed below (see PT Problem List). Ambulated up to 525 feet with minimal use of a rolling walker for support today. Safely completed stair training.no loss of balance noted. Reviewed back precautions and bed mobility techniques. Adequate for d/c from PT standpoint when medically ready.Will follow until d/c.    Follow Up Recommendations No PT follow up;Supervision - Intermittent    Equipment Recommendations  Rolling walker with 5" wheels    Recommendations for Other Services       Precautions / Restrictions Precautions Precautions: Back Precaution Booklet Issued: Yes (comment) Precaution Comments: Reviewed handout with precautions Required Braces or Orthoses: Spinal Brace Spinal Brace: Lumbar corset;Applied in sitting position Restrictions Weight Bearing Restrictions: No      Mobility  Bed Mobility Overal bed mobility: Needs Assistance Bed Mobility: Rolling;Sidelying to Sit;Sit to Sidelying Rolling: Modified independent (Device/Increase time) Sidelying to sit: Modified independent (Device/Increase time)     Sit to sidelying: Supervision General bed mobility comments: Mod I requiring extra time to roll and perform sidelying to sit transition.Mantains back precautions. Unsure of how to return to sideyling from seated position and therefore educated and provided cues and supervision for safety. No physical assist needed and safely maintained back precautions.  Transfers Overall transfer level: Needs assistance Equipment used: Rolling walker (2  wheeled) Transfers: Sit to/from Stand Sit to Stand: Supervision         General transfer comment: Supervision for safety,  VC for hand placement. RW used for support upon standing.  Ambulation/Gait Ambulation/Gait assistance: Supervision Ambulation Distance (Feet): 565 Feet Assistive device: Rolling walker (2 wheeled) Gait Pattern/deviations: Step-through pattern;Decreased stride length Gait velocity: decreased   General Gait Details: Able to self correct posture and demonstrates good control of RW. VC to maintain back precautions intermittently with turns to prevent twisting. No loss of balance or buckling noted. Denies any symptoms in LEs.  Stairs Stairs: Yes Stairs assistance: Supervision Stair Management: One rail Right;Step to pattern;Sideways Number of Stairs: 5 General stair comments: Educated on safe stair navigation techniques useing sideways approach. Performed safely without loss of balance. Cues for technique.  Wheelchair Mobility    Modified Rankin (Stroke Patients Only)       Balance Overall balance assessment: Needs assistance Sitting-balance support: No upper extremity supported;Feet supported Sitting balance-Leahy Scale: Good     Standing balance support: No upper extremity supported Standing balance-Leahy Scale: Good                               Pertinent Vitals/Pain Pain Assessment: No/denies pain ("Hurts when I try to lie back down")    Home Living Family/patient expects to be discharged to:: Private residence Living Arrangements: Spouse/significant other Available Help at Discharge: Family;Friend(Bates);Available 24 hours/day Type of Home: House Home Access: Stairs to enter;Ramped entrance;Other (comment) (Pt reports stairs easier to access than ramp) Entrance Stairs-Rails: Right Entrance Stairs-Number of Steps: 5 Home Layout: Multi-level;Able to live on main level with bedroom/bathroom Home Equipment: None      Prior Function  Level of Independence: Independent         Comments: Pt  employeed as a Runner, broadcasting/film/videoworker comp nurse     Hand Dominance   Dominant Hand: Right    Extremity/Trunk Assessment   Upper Extremity Assessment: Defer to OT evaluation           Lower Extremity Assessment: Overall WFL for tasks assessed         Communication   Communication: No difficulties  Cognition Arousal/Alertness: Awake/alert Behavior During Therapy: WFL for tasks assessed/performed Overall Cognitive Status: Within Functional Limits for tasks assessed                      General Comments General comments (skin integrity, edema, etc.): Minimal use of RW for support. Able to donne spinal brace    Exercises        Assessment/Plan    PT Assessment Patient needs continued PT services  PT Diagnosis Abnormality of gait;Difficulty walking   PT Problem List Decreased activity tolerance;Decreased balance;Decreased mobility;Decreased knowledge of precautions;Pain  PT Treatment Interventions DME instruction;Gait training;Stair training;Functional mobility training;Therapeutic activities;Therapeutic exercise;Balance training;Neuromuscular re-education;Patient/family education;Modalities   PT Goals (Current goals can be found in the Care Plan section) Acute Rehab PT Goals Patient Stated Goal: to go home PT Goal Formulation: With patient Time For Goal Achievement: 10/15/14 Potential to Achieve Goals: Good    Frequency Min 5X/week   Barriers to discharge        Co-evaluation               End of Session Equipment Utilized During Treatment: Back brace Activity Tolerance: Patient tolerated treatment well Patient left: in bed;with call bell/phone within reach;with family/visitor present Nurse Communication: Mobility status         Time: 1610-96041008-1026 PT Time Calculation (min) (ACUTE ONLY): 18 min   Charges:   PT Evaluation $Initial PT Evaluation Tier I: 1 Procedure     PT G CodesBerton Mount:         Deborah Bates 10/01/2014, 12:16 PM Deborah Bates, [place] [phone]

## 2014-10-01 NOTE — Progress Notes (Signed)
Patient appears unhappy about bed alarm being activated verbalized " i have a disorder and these alarms are bothering me". RN reassure that safety is our main goal and requests for patient to please call for any assistance OOB. Husband at bedside at this time. Will continue to monitor.   Deborah Bates. RN

## 2014-10-01 NOTE — Progress Notes (Signed)
   10/01/14 0749  Hygiene  Hygiene Peri care  Level of Assistance Minimal assist  Mobility  Activity Ambulate in hall;Ambulate in room;Bathroom privileges;Back to bed  Level of Assistance Standby assist, set-up cues, supervision of patient - no hands on  Assistive Device Front wheel walker  Bed Position Semi-fowlers  Range of Motion Active;All extremities    Patient verbalized she is not comfortable in chair. Patient walked the hallway and back to bed this AM. She is able to passed flatus at this time. Pain medication administered. Will continue to monitor.

## 2014-10-01 NOTE — Care Management Note (Signed)
Case Management Note  Patient Details  Name: Hinton DyerJessica M Forstner MRN: [id number] Date of Birth: [birth year]  Subjective/Objective:     49 y.o. F admitted for Decompression Fusion L3-4, L4-5, Post LIF. Lives with Spouse in Private Residence and chart reflects has family support. (pt and family asleep when CM attempted to interview). Will f/u after lunch. PT/OT eval Pending. Do not anticipate any discharge needs.               Action/Plan: Continue to follow.    Expected Discharge Date:                  Expected Discharge Plan:  Home w Home Health Services  In-House Referral:     Discharge planning Services  CM Consult  Post Acute Care Choice:    Choice offered to:  Patient  DME Arranged:    DME Agency:     HH Arranged:    HH Agency:     Status of Service:  In process, will continue to follow  Medicare Important Message Given:    Date Medicare IM Given:    Medicare IM give by:    Date Additional Medicare IM Given:    Additional Medicare Important Message give by:     If discussed at Long Length of Stay Meetings, dates discussed:    Additional Comments:  Yvone NeuCrutchfield, Keanen Dohse M, RN 10/01/2014, 11:26 AM

## 2014-10-01 NOTE — Progress Notes (Signed)
Patient ID: Deborah Bates, female   DOB: 01-05-1966, 49 y.o.   MRN: [id number] vss Motor function intact, walking this am Has some right anterior thigh pain Incision clean and dry.  Doing well.

## 2014-10-01 NOTE — Evaluation (Signed)
Occupational Therapy Evaluation Patient Details Name: Deborah Bates MRN: [id number] DOB: 20-Dec-1965 Today's Date: 10/01/2014    History of Present Illness 49 yo female s/p PLIF L3-4 L4-5 PMH: tacycardia, tinnitus bil ears, neuromuscular disorder, miningitis, back surg 2012, carpal tunnel release, hernia repair   Clinical Impression   Patient is s/p PLIF L3-4 L4-5 surgery resulting in functional limitations due to the deficits listed below (see OT problem list). Education and handout provided on precautions. Pt able to maintain precautions while completing ADLs with supervision. Pt reports spouse will be available to assist with ADLs upon d/c. Patient will benefit from skilled OT acutely to increase independence and safety with ADLS to allow discharge home.     Follow Up Recommendations  No OT follow up;Supervision - Intermittent    Equipment Recommendations  None recommended by OT    Recommendations for Other Services       Precautions / Restrictions Precautions Precautions: Back Precaution Booklet Issued: Yes (comment) Precaution Comments: back handout Required Braces or Orthoses: Spinal Brace Spinal Brace: Lumbar corset;Applied in sitting position Restrictions Weight Bearing Restrictions: No      Mobility Bed Mobility Overal bed mobility: Needs Assistance Bed Mobility: Rolling;Sidelying to Sit;Sit to Sidelying Rolling: Supervision Sidelying to sit: Min guard;HOB elevated     Sit to sidelying: Min assist    Transfers Overall transfer level: Needs assistance Equipment used: Rolling walker (2 wheeled) Transfers: Sit to/from Stand Sit to Stand: Modified independent (Device/Increase time)         General transfer comment: VC for hand placement on RW    Balance Overall balance assessment: Needs assistance Sitting-balance support: No upper extremity supported;Feet supported Sitting balance-Leahy Scale: Good     Standing balance support: No upper  extremity supported;During functional activity Standing balance-Leahy Scale: Good                              ADL Overall ADL's : Needs assistance/impaired Eating/Feeding: Independent;Sitting   Grooming: Wash/dry hands;Wash/dry face;Oral care;Supervision/safety;Standing (cuing for back precautions)   Upper Body Bathing: Supervision/ safety;Sitting   Lower Body Bathing: Supervison/ safety;Sit to/from stand   Upper Body Dressing : Supervision/safety;Sitting (don brace)   Lower Body Dressing: Supervision/safety;Adhering to back precautions;Sit to/from stand   Toilet Transfer: Min guard;Ambulation;Regular Toilet;RW   Toileting- Clothing Manipulation and Hygiene: Modified independent;Sit to/from stand   Tub/ Shower Transfer: Minimal assistance;Ambulation;Rolling walker   Functional mobility during ADLs: Min guard;Rolling walker       Vision Vision Assessment?: No apparent visual deficits   Perception     Praxis      Pertinent Vitals/Pain Pain Assessment: No/denies pain     Hand Dominance Right   Extremity/Trunk Assessment Upper Extremity Assessment Upper Extremity Assessment: Overall WFL for tasks assessed   Lower Extremity Assessment Lower Extremity Assessment: Overall WFL for tasks assessed       Communication Communication Communication: No difficulties   Cognition Arousal/Alertness: Awake/alert Behavior During Therapy: WFL for tasks assessed/performed Overall Cognitive Status: Within Functional Limits for tasks assessed                     General Comments       Exercises       Shoulder Instructions      Home Living Family/patient expects to be discharged to:: Private residence Living Arrangements: Spouse/significant other Available Help at Discharge: Family;Friend(s);Available 24 hours/day Type of Home: House Home Access: Stairs to enter;Ramped entrance;Other (comment) (Pt  reports stairs easier to access than ramp)      Home Layout: Multi-level;Able to live on main level with bedroom/bathroom     Bathroom Shower/Tub: Tub/shower unit;Curtain Shower/tub characteristics: Engineer, building servicesCurtain Bathroom Toilet: Standard     Home Equipment: None          Prior Functioning/Environment Level of Independence: Independent        Comments: 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    OT Diagnosis: Generalized weakness   OT Problem List: Decreased strength;Decreased activity tolerance;Impaired balance (sitting and/or standing);Decreased knowledge of use of DME or AE;Decreased knowledge of precautions;Pain   OT Treatment/Interventions: Self-care/ADL training;Therapeutic exercise;DME and/or AE instruction;Therapeutic activities;Patient/family education;Balance training    OT Goals(Current goals can be found in the care plan section) Acute Rehab OT Goals Patient Stated Goal: to go home OT Goal Formulation: With patient Time For Goal Achievement: 10/15/14 Potential to Achieve Goals: Good ADL Goals Pt Will Perform Upper Body Bathing: with supervision;sitting Pt Will Perform Lower Body Bathing: with supervision;sit to/from stand Pt Will Perform Tub/Shower Transfer: Tub transfer;with min guard assist;ambulating;rolling walker Additional ADL Goal #1: Pt will verbalize 3/3 precautions as precursor to ADLs  OT Frequency: Min 2X/week   Barriers to D/C:            Co-evaluation              End of Session Equipment Utilized During Treatment: Gait belt;Rolling walker;Back brace Nurse Communication: Mobility status;Precautions  Activity Tolerance: Patient tolerated treatment well;No increased pain Patient left: in bed;with call bell/phone within reach;with bed alarm set;with family/visitor present   Time: 0830-0903 OT Time Calculation (min): 33 min Charges:  OT General Charges $OT Visit: 1 Procedure OT Evaluation $Initial OT Evaluation Tier I: 1 Procedure OT Treatments $Self Care/Home Management : 8-22  mins G-Codes:    Marden NobleMary Rebecca Emmalise Bates 10/01/2014, 10:09 AM

## 2014-10-02 MED ORDER — DEXAMETHASONE 1 MG PO TABS: ORAL_TABLET | ORAL | Status: AC

## 2014-10-02 MED ORDER — OXYCODONE-ACETAMINOPHEN 5-325 MG PO TABS: 1.0000 | ORAL_TABLET | ORAL | Status: AC | PRN

## 2014-10-02 MED ORDER — OXYCODONE HCL 15 MG PO TABS: 15.0000 mg | ORAL_TABLET | ORAL | Status: AC | PRN

## 2014-10-02 NOTE — Progress Notes (Signed)
Patient being discharged home pain controled, vital signs within normal range. Discharge and prescription information verified , I V removed husband transporting her home.

## 2014-10-02 NOTE — Care Management Note (Signed)
Case Management Note  Patient Details  Name: Deborah Bates MRN: [id number] Date of Birth: 01-15-1966  Subjective/Objective:                    Action/Plan: Per conversation with Dr Danielle Dess, patient is to be discharged home without any home health services or DME.  Expected Discharge Date:                  Expected Discharge Plan:  Home/Self Care  In-House Referral:     Discharge planning Services  CM Consult  Post Acute Care Choice:    Choice offered to:  Patient  DME Arranged:    DME Agency:     HH Arranged:    HH Agency:     Status of Service:  Completed, signed off  Medicare Important Message Given:    Date Medicare IM Given:    Medicare IM give by:    Date Additional Medicare IM Given:    Additional Medicare Important Message give by:     If discussed at Long Length of Stay Meetings, dates discussed:    Additional Comments:  Anda Kraft, RN 10/02/2014, 10:04 AM

## 2014-10-02 NOTE — Discharge Summary (Signed)
Physician Discharge Summary  Patient ID: Deborah Bates MRN: [id number] DOB/AGE: [birth year] 49 y.o.  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 She tolerated surgery well. Her incision is clean and dry.  Consults: None  Significant Diagnostic Studies: None  Treatments: surgery: Decompression L3-4 and L4-5 posterior lumbar interbody arthrodesis with peek spacers local autograft and allograft. Segmental fixation L3-L5.  Discharge Exam: Blood pressure 99/58, pulse 100, temperature 98.1 F (36.7 C), temperature source Oral, resp. rate 18, height 5\' 6"  (1.676 m), weight 70.761 kg (156 lb), SpO2 99 %. Incision is clean and dry. Motor function is intact in lower extremities.  Disposition: Discharge home  Discharge Instructions    Call MD for:  redness, tenderness, or signs of infection (pain, swelling, redness, odor or green/yellow discharge around incision site)    Complete by:  As directed      Call MD for:  severe uncontrolled pain    Complete by:  As directed      Call MD for:  temperature >100.4    Complete by:  As directed      Diet - low sodium heart healthy    Complete by:  As directed      Discharge instructions    Complete by:  As directed   Okay to shower. Do not apply salves or appointments to incision. No heavy lifting with the upper extremities greater than 15 pounds. May resume driving when not requiring  pain medication and patient feels comfortable with doing so.     Increase activity slowly    Complete by:  As directed             Medication List    TAKE these medications        dexamethasone 1 MG tablet  Commonly known as:  DECADRON  2 tablets twice daily for 2 days, one tablet twice daily for 2 days, one tablet daily for 2 days.     oxyCODONE 15 MG immediate release tablet  Commonly known as:  ROXICODONE  Take 1 tablet (15 mg total) by mouth every 4 (four) hours as needed for severe pain.     oxyCODONE-acetaminophen 5-325 MG per tablet  Commonly known as:  PERCOCET/ROXICET  Take 1-2 tablets by mouth every 4 (four) hours as needed for moderate pain.      ASK your doctor about these medications        clonazePAM 0.5 MG tablet  Commonly known as:  KLONOPIN  Take 0.5 mg by mouth 2 (two) times daily.     cyclobenzaprine 10 MG tablet  Commonly known as:  FLEXERIL  Take 10 mg by mouth 3 (three) times daily as needed for muscle spasms.     DULoxetine 30 MG capsule  Commonly known as:  CYMBALTA  Take 30 mg by mouth every morning.     eszopiclone 2 MG Tabs tablet  Commonly known as:  LUNESTA  Take 2 mg by mouth at bedtime.     mirtazapine 15 MG tablet  Commonly known as:  REMERON  Take 15 mg by  mouth at bedtime.     PROVENTIL HFA 108 (90 BASE) MCG/ACT inhaler  Generic drug:  albuterol  Take 2 puffs by mouth every 4 (four) hours as needed for wheezing.     topiramate 100 MG tablet  Commonly known as:  TOPAMAX  Take 50-100 mg by mouth 3 (three) times daily. 50 mg twice daily and 100 mg at bedtime     traMADol 50 MG tablet  Commonly known as:  ULTRAM  Take 50 mg by mouth 4 (four) times daily.     valACYclovir 1000 MG tablet  Commonly known as:  VALTREX  Take 1 g by mouth as needed (cold sores).     varenicline 1 MG tablet  Commonly known as:  CHANTIX  Take 1 mg by mouth 2 (two) times daily.         SignedStefani Dama 10/02/2014, 8:52 AM

## 2014-10-02 NOTE — Progress Notes (Signed)
Occupational Therapy Treatment Patient Details Name: Deborah Bates MRN: [id number] DOB: 23-Jan-1966 Today's Date: 10/02/2014    History of present illness 49 yo female s/p PLIF L3-4 L4-5 PMH: tacycardia, tinnitus bil ears, neuromuscular disorder, miningitis, back surg 2012, carpal tunnel release, hernia repair   OT comments  Education and demonstration provided on tub transfer and maintaining precautions during ADLs at home. Pt able to verbalize 3/3 precautions, and able to maintain precautions during functional activities. No further OT needed at this time.    Follow Up Recommendations  No OT follow up;Supervision - Intermittent    Equipment Recommendations  None recommended by OT    Recommendations for Other Services      Precautions / Restrictions Precautions Precautions: Back Precaution Booklet Issued: Yes (comment) Precaution Comments: Reviewed handout with precautions Required Braces or Orthoses: Spinal Brace Spinal Brace: Lumbar corset;Applied in sitting position Restrictions Weight Bearing Restrictions: No       Mobility Bed Mobility               General bed mobility comments: Pt seated EOB upon arrival  Transfers Overall transfer level: Needs assistance Equipment used: Rolling walker (2 wheeled) Transfers: Sit to/from Stand Sit to Stand: Supervision              Balance Overall balance assessment: Needs assistance Sitting-balance support: No upper extremity supported;Feet supported Sitting balance-Leahy Scale: Good     Standing balance support: No upper extremity supported;During functional activity Standing balance-Leahy Scale: Good                     ADL                                   Tub/ Shower Transfer: Tub transfer;Min guard;Ambulation;Rolling walker   Functional mobility during ADLs: Supervision/safety;Rolling walker General ADL Comments: Education provided on maintaining precautions when bathing; Pt  reports they will install grab bars in shower to assist with tub transfer      Vision                     Perception     Praxis      Cognition   Behavior During Therapy: [facility] for tasks assessed/performed Overall Cognitive Status: Within Functional Limits for tasks assessed                       Extremity/Trunk Assessment               Exercises     Shoulder Instructions       General Comments      Pertinent Vitals/ Pain       Pain Assessment: No/denies pain  Home Living                                          Prior Functioning/Environment              Frequency Min 2X/week     Progress Toward Goals  OT Goals(current goals can now be found in the care plan section)  Progress towards OT goals: Progressing toward goals  Acute Rehab OT Goals Patient Stated Goal: to go home OT Goal Formulation: With patient Time For Goal Achievement: 10/15/14 Potential to Achieve Goals: Good ADL Goals Pt Will Perform Upper  Body Bathing: with supervision;sitting Pt Will Perform Lower Body Bathing: with supervision;sit to/from stand Pt Will Perform Tub/Shower Transfer: Tub transfer;with min guard assist;ambulating;rolling walker Additional ADL Goal #1: Pt will verbalize 3/3 precautions as precursor to ADLs  Plan Discharge plan remains appropriate    Co-evaluation                 End of Session Equipment Utilized During Treatment: Gait belt;Rolling walker;Back brace   Activity Tolerance Patient tolerated treatment well;No increased pain   Patient Left in chair;with call bell/phone within reach;with family/visitor present   Nurse Communication Mobility status;Precautions        Time: 7829-5621 OT Time Calculation (min): 9 min  Charges: OT General Charges $OT Visit: 1 Procedure OT Treatments $Self Care/Home Management : 8-22 mins  Marden Noble 10/02/2014, 10:56 AM

## 2020-07-20 ENCOUNTER — Other Ambulatory Visit: Payer: Self-pay | Admitting: Neurological Surgery

## 2020-08-12 NOTE — Progress Notes (Signed)
Surgical Instructions    Your procedure is scheduled on 08/17/20.  Report to [facility] Main Entrance "A" at 05:30 A.M., then check in with the Admitting office.  Call this number if you have problems the morning of surgery:  [phone]   If you have any questions prior to your surgery date call [phone]: Open Monday-Friday 8am-4pm    Remember:  Do not eat or drink after midnight the night before your surgery      Take these medicines the morning of surgery with A SIP OF WATER  cetirizine (ZYRTEC)  clonazePAM (KLONOPIN) if needed cyclobenzaprine (FLEXERIL) if needed oxyCODONE-acetaminophen (PERCOCET) if needed PROVENTIL HFA inhaler if needed (bring inhalers with you the day of surgery)\ rosuvastatin (CRESTOR)  traMADol (ULTRAM)  triamcinolone (NASACORT) if needed valACYclovir (VALTREX) if needed    As of today, STOP taking any Aspirin (unless otherwise instructed by your surgeon) Aleve, Naproxen, Ibuprofen, Motrin, Advil, Goody's, BC's, all herbal medications, fish oil, and all vitamins.          Do not wear jewelry or makeup Do not wear lotions, powders, perfumes, or deodorant. Do not shave 48 hours prior to surgery.   Do not bring valuables to the hospital. DO Not wear nail polish, gel polish, artificial nails, or any other type of covering on natural nails             including finger and toenails. If patients have artificial nails, gel coating, etc. that need to be removed by a nail salon please have this removed prior to surgery or surgery may need to be canceled/delayed if the surgeon/ anesthesia feels like the patient is unable to be adequately monitored.             [facility] is not responsible for any belongings or valuables.  Do NOT Smoke (Tobacco/Vaping) or drink Alcohol 24 hours prior to your procedure If you use a CPAP at night, you may bring all equipment for your overnight stay.   Contacts, glasses, dentures or bridgework may not be worn into surgery,  please bring cases for these belongings   For patients admitted to the hospital, discharge time will be determined by your treatment team.   Patients discharged the day of surgery will not be allowed to drive home, and someone needs to stay with them for 24 hours.    Special instructions:    Oral Hygiene is also important to reduce your risk of infection.  Remember - BRUSH YOUR TEETH THE MORNING OF SURGERY WITH YOUR REGULAR TOOTHPASTE   - Preparing For Surgery  Before surgery, you can play an important role. Because skin is not sterile, your skin needs to be as free of germs as possible. You can reduce the number of germs on your skin by washing with CHG (chlorahexidine gluconate) Soap before surgery.  CHG is an antiseptic cleaner which kills germs and bonds with the skin to continue killing germs even after washing.     Please do not use if you have an allergy to CHG or antibacterial soaps. If your skin becomes reddened/irritated stop using the CHG.  Do not shave (including legs and underarms) for at least 48 hours prior to first CHG shower. It is OK to shave your face.  Please follow these instructions carefully.    1.  Shower the NIGHT BEFORE SURGERY and the MORNING OF SURGERY with CHG Soap.   If you chose to wash your hair, wash your hair first as usual with your normal  shampoo. After you shampoo, rinse your hair and body thoroughly to remove the shampoo.  Then [employer] and genitals (private parts) with your normal soap and rinse thoroughly to remove soap.  2. After that Use CHG Soap as you would any other liquid soap. You can apply CHG directly to the skin and wash gently with a scrungie or a clean washcloth.   3. Apply the CHG Soap to your body ONLY FROM THE NECK DOWN.  Do not use on open wounds or open sores. Avoid contact with your eyes, ears, mouth and genitals (private parts). Wash Face and genitals (private parts)  with your normal soap.   4. Wash thoroughly,  paying special attention to the area where your surgery will be performed.  5. Thoroughly rinse your body with warm water from the neck down.  6. DO NOT shower/wash with your normal soap after using and rinsing off the CHG Soap.  7. Pat yourself dry with a CLEAN TOWEL.  8. Wear CLEAN PAJAMAS to bed the night before surgery  9. Place CLEAN SHEETS on your bed the night before your surgery  10. DO NOT SLEEP WITH PETS.   Day of Surgery: Take a shower with CHG soap. Wear Clean/Comfortable clothing the morning of surgery Do not apply any deodorants/lotions.   Remember to brush your teeth WITH YOUR REGULAR TOOTHPASTE.   Please read over the following fact sheets that you were given.

## 2020-08-13 ENCOUNTER — Inpatient Hospital Stay (HOSPITAL_COMMUNITY)
Admission: RE | Admit: 2020-08-13 | Discharge: 2020-08-13 | Disposition: A | Discharge status: Home / Self Care | Payer: PRIVATE HEALTH INSURANCE | Source: Ambulatory Visit

## 2020-08-17 ENCOUNTER — Encounter (HOSPITAL_COMMUNITY): Admission: RE | Payer: Self-pay | Source: Home / Self Care

## 2020-08-17 ENCOUNTER — Inpatient Hospital Stay (HOSPITAL_COMMUNITY): Admission: RE | Admit: 2020-08-17 | Payer: 59 | Source: Home / Self Care | Admitting: Neurological Surgery

## 2020-08-17 SURGERY — POSTERIOR LUMBAR FUSION 1 LEVEL
Anesthesia: General | Site: Back

## 2020-09-23 ENCOUNTER — Other Ambulatory Visit: Payer: Self-pay | Admitting: Neurological Surgery

## 2020-11-30 NOTE — Progress Notes (Signed)
Surgical Instructions    Your procedure is scheduled on 12/03/20.  Report to [facility] Main Entrance "A" at 05:30 A.M., then check in with the Admitting office.  Call this number if you have problems the morning of surgery:  [phone]   If you have any questions prior to your surgery date call [phone]: Open Monday-Friday 8am-4pm    Remember:  Do not eat after midnight the night before your surgery  You may drink clear liquids until 04:30am the morning of your surgery.   Clear liquids allowed are: Water, Non-Citrus Juices (without pulp), Carbonated Beverages, Clear Tea, Black Coffee ONLY (NO MILK, CREAM OR POWDERED CREAMER of any kind), and Gatorade    Take these medicines the morning of surgery with A SIP OF WATER  cetirizine (ZYRTEC)  rosuvastatin (CRESTOR)  topiramate (TOPAMAX)   IF NEEDED: clonazePAM (KLONOPIN)  oxyCODONE-acetaminophen (PERCOCET)  PROVENTIL HFA 108 (90 BASE) MCG/ACT inhaler triamcinolone (NASACORT)  As of today, STOP taking any Aspirin (unless otherwise instructed by your surgeon) Aleve, Naproxen, Ibuprofen, Motrin, Advil, Goody's, BC's, all herbal medications, fish oil, and all vitamins.          Do not wear jewelry or makeup Do not wear lotions, powders, perfumes/colognes, or deodorant. Do not shave 48 hours prior to surgery.   Do not bring valuables to the hospital.  DO Not wear nail polish, gel polish, artificial nails, or any other type of covering on natural nails including finger and toenails. If patients have artificial nails, gel coating, etc. that need to be removed by a nail salon please have this removed prior to surgery or surgery may need to be canceled/delayed if the surgeon/ anesthesia feels like the patient is unable to be adequately monitored.             [facility] is not responsible for any belongings or valuables.  Do NOT Smoke (Tobacco/Vaping)  24 hours prior to your procedure If you use a CPAP at night, you may bring your  mask for your overnight stay.   Contacts, glasses, dentures or bridgework may not be worn into surgery, please bring cases for these belongings   For patients admitted to the hospital, discharge time will be determined by your treatment team.   Patients discharged the day of surgery will not be allowed to drive home, and someone needs to stay with them for 24 hours.  NO VISITORS WILL BE ALLOWED IN PRE-OP WHERE PATIENTS GET READY FOR SURGERY.  ONLY 1 SUPPORT PERSON MAY BE PRESENT WHILE YOU ARE IN SURGERY.  IF YOU ARE TO BE ADMITTED, ONCE YOU ARE IN YOUR ROOM YOU WILL BE ALLOWED TWO (2) VISITORS.  Minor children may have two parents present. Special consideration for safety and communication needs will be reviewed on a case by case basis.  Special instructions:    Oral Hygiene is also important to reduce your risk of infection.  Remember - BRUSH YOUR TEETH THE MORNING OF SURGERY WITH YOUR REGULAR TOOTHPASTE   [facility]- Preparing For Surgery  Before surgery, you can play an important role. Because skin is not sterile, your skin needs to be as free of germs as possible. You can reduce the number of germs on your skin by washing with CHG (chlorahexidine gluconate) Soap before surgery.  CHG is an antiseptic cleaner which kills germs and bonds with the skin to continue killing germs even after washing.     Please do not use if you have an allergy to CHG or antibacterial  soaps. If your skin becomes reddened/irritated stop using the CHG.  Do not shave (including legs and underarms) for at least 48 hours prior to first CHG shower. It is OK to shave your face.  Please follow these instructions carefully.     Shower the NIGHT BEFORE SURGERY and the MORNING OF SURGERY with CHG Soap.   If you chose to wash your hair, wash your hair first as usual with your normal shampoo. After you shampoo, rinse your hair and body thoroughly to remove the shampoo.  Then [employer] and genitals (private parts) with  your normal soap and rinse thoroughly to remove soap.  After that Use CHG Soap as you would any other liquid soap. You can apply CHG directly to the skin and wash gently with a scrungie or a clean washcloth.   Apply the CHG Soap to your body ONLY FROM THE NECK DOWN.  Do not use on open wounds or open sores. Avoid contact with your eyes, ears, mouth and genitals (private parts). Wash Face and genitals (private parts)  with your normal soap.   Wash thoroughly, paying special attention to the area where your surgery will be performed.  Thoroughly rinse your body with warm water from the neck down.  DO NOT shower/wash with your normal soap after using and rinsing off the CHG Soap.  Pat yourself dry with a CLEAN TOWEL.  Wear CLEAN PAJAMAS to bed the night before surgery  Place CLEAN SHEETS on your bed the night before your surgery  DO NOT SLEEP WITH PETS.   Day of Surgery: Take a shower with CHG soap. Wear Clean/Comfortable clothing the morning of surgery Do not apply any deodorants/lotions.   Remember to brush your teeth WITH YOUR REGULAR TOOTHPASTE.   Please read over the following fact sheets that you were given.

## 2020-12-01 ENCOUNTER — Other Ambulatory Visit: Payer: Self-pay

## 2020-12-01 ENCOUNTER — Encounter (HOSPITAL_COMMUNITY): Payer: Self-pay

## 2020-12-01 ENCOUNTER — Encounter (HOSPITAL_COMMUNITY)
Admission: RE | Admit: 2020-12-01 | Discharge: 2020-12-01 | Disposition: A | Discharge status: Home / Self Care | Payer: 59 | Source: Ambulatory Visit | Attending: Neurological Surgery | Admitting: Neurological Surgery

## 2020-12-01 DIAGNOSIS — Z20822 Contact with and (suspected) exposure to covid-19: Secondary | ICD-10-CM | POA: Insufficient documentation

## 2020-12-01 DIAGNOSIS — Z01812 Encounter for preprocedural laboratory examination: Secondary | ICD-10-CM | POA: Insufficient documentation

## 2020-12-01 LAB — CBC
HCT: 41.7 % (ref 36.0–46.0)
Hemoglobin: 13.5 g/dL (ref 12.0–15.0)
MCH: 29.9 pg (ref 26.0–34.0)
MCHC: 32.4 g/dL (ref 30.0–36.0)
MCV: 92.3 fL (ref 80.0–100.0)
Platelets: 220 10*3/uL (ref 150–400)
RBC: 4.52 MIL/uL (ref 3.87–5.11)
RDW: 13.2 % (ref 11.5–15.5)
WBC: 9.7 10*3/uL (ref 4.0–10.5)
nRBC: 0 % (ref 0.0–0.2)

## 2020-12-01 LAB — SURGICAL PCR SCREEN
MRSA, PCR: NEGATIVE
Staphylococcus aureus: POSITIVE — AB

## 2020-12-01 LAB — TYPE AND SCREEN
ABO/RH(D): AB POS
Antibody Screen: NEGATIVE

## 2020-12-01 LAB — SARS CORONAVIRUS 2 (TAT 6-24 HRS): SARS Coronavirus 2: NEGATIVE

## 2020-12-01 NOTE — Progress Notes (Signed)
PCP - Deborah Bates Cardiologist - denies Deborah Bates: Sleep neurologist Pain management: Deborah Bates  PPM/ICD - denies   Chest x-ray - n/a EKG - n/a Stress Test - denies ECHO - denies Cardiac Cath - denies  Sleep Study - denies  No diabetes  As of today, STOP taking any Aspirin (unless otherwise instructed by your surgeon) Aleve, Naproxen, Ibuprofen, Motrin, Advil, Goody's, BC's, all herbal medications, fish oil, and all vitamins.  ERAS Protcol -yes   COVID TEST- performed in PAT 12/01/20   Anesthesia review: no  Patient denies shortness of breath, fever, cough and chest pain at PAT appointment   All instructions explained to the patient, with a verbal understanding of the material. Patient agrees to go over the instructions while at home for a better understanding. Patient also instructed to self quarantine after being tested for COVID-19. The opportunity to ask questions was provided.

## 2020-12-02 NOTE — Anesthesia Preprocedure Evaluation (Addendum)
Anesthesia Evaluation  Patient identified by MRN, date of birth, ID band Patient awake    Reviewed: Allergy & Precautions, NPO status , Patient's Chart, lab work & pertinent test results  History of Anesthesia Complications (+) PROLONGED EMERGENCE and history of anesthetic complications  Airway Mallampati: II  TM Distance: >3 FB Neck ROM: Full    Dental  (+) Dental Advisory Given, Teeth Intact   Pulmonary former smoker,    Pulmonary exam normal        Cardiovascular negative cardio ROS Normal cardiovascular exam     Neuro/Psych  Headaches,  Neuromuscular disease negative psych ROS   GI/Hepatic negative GI ROS, Neg liver ROS,   Endo/Other  negative endocrine ROS  Renal/GU negative Renal ROS     Musculoskeletal negative musculoskeletal ROS (+)   Abdominal   Peds  Hematology negative hematology ROS (+)   Anesthesia Other Findings   Reproductive/Obstetrics  s/p hysterectomy                             Anesthesia Physical Anesthesia Plan  ASA: 2  Anesthesia Plan: General   Post-op Pain Management:    Induction: Intravenous  PONV Risk Score and Plan: 3 and Treatment may vary due to age or medical condition, Ondansetron, Dexamethasone, Midazolam and Scopolamine patch - Pre-op  Airway Management Planned: Oral ETT  Additional Equipment: None  Intra-op Plan:   Post-operative Plan: Extubation in OR  Informed Consent: I have reviewed the patients History and Physical, chart, labs and discussed the procedure including the risks, benefits and alternatives for the proposed anesthesia with the patient or authorized representative who has indicated his/her understanding and acceptance.     Dental advisory given  Plan Discussed with: CRNA and Anesthesiologist  Anesthesia Plan Comments: (Ketamine intraop, tylenol preop)       Anesthesia Quick Evaluation

## 2020-12-03 ENCOUNTER — Encounter (HOSPITAL_COMMUNITY)
Admission: RE | Disposition: A | Discharge status: Home / Self Care | Payer: Self-pay | Source: Ambulatory Visit | Attending: Neurological Surgery

## 2020-12-03 ENCOUNTER — Inpatient Hospital Stay (HOSPITAL_COMMUNITY): Payer: 59

## 2020-12-03 ENCOUNTER — Encounter (HOSPITAL_COMMUNITY): Payer: Self-pay | Admitting: Neurological Surgery

## 2020-12-03 ENCOUNTER — Other Ambulatory Visit: Payer: Self-pay

## 2020-12-03 ENCOUNTER — Inpatient Hospital Stay (HOSPITAL_COMMUNITY)
Admission: RE | Admit: 2020-12-03 | Discharge: 2020-12-04 | DRG: 455 | Disposition: A | Discharge status: Home / Self Care | Payer: 59 | Attending: Neurological Surgery | Admitting: Neurological Surgery

## 2020-12-03 DIAGNOSIS — Z419 Encounter for procedure for purposes other than remedying health state, unspecified: Secondary | ICD-10-CM

## 2020-12-03 DIAGNOSIS — Z90710 Acquired absence of both cervix and uterus: Secondary | ICD-10-CM

## 2020-12-03 DIAGNOSIS — Z981 Arthrodesis status: Secondary | ICD-10-CM

## 2020-12-03 DIAGNOSIS — Z8661 Personal history of infections of the central nervous system: Secondary | ICD-10-CM

## 2020-12-03 DIAGNOSIS — M4316 Spondylolisthesis, lumbar region: Secondary | ICD-10-CM | POA: Diagnosis present

## 2020-12-03 DIAGNOSIS — G8929 Other chronic pain: Secondary | ICD-10-CM | POA: Diagnosis present

## 2020-12-03 DIAGNOSIS — Z20822 Contact with and (suspected) exposure to covid-19: Secondary | ICD-10-CM | POA: Diagnosis present

## 2020-12-03 DIAGNOSIS — M5416 Radiculopathy, lumbar region: Principal | ICD-10-CM | POA: Diagnosis present

## 2020-12-03 DIAGNOSIS — Z87891 Personal history of nicotine dependence: Secondary | ICD-10-CM

## 2020-12-03 DIAGNOSIS — M48062 Spinal stenosis, lumbar region with neurogenic claudication: Secondary | ICD-10-CM | POA: Diagnosis present

## 2020-12-03 SURGERY — POSTERIOR LUMBAR FUSION 1 LEVEL
Anesthesia: General | Site: Back

## 2020-12-03 MED ORDER — OXYCODONE HCL 5 MG PO TABS
5.0000 mg | ORAL_TABLET | Freq: Once | ORAL | Status: AC | PRN
  Administered 2020-12-03: 5 mg via ORAL

## 2020-12-03 MED ORDER — CHLORHEXIDINE GLUCONATE 0.12 % MT SOLN
OROMUCOSAL | Status: AC
  Administered 2020-12-03: 15 mL via OROMUCOSAL
  Filled 2020-12-03: qty 15

## 2020-12-03 MED ORDER — ACETAMINOPHEN 500 MG PO TABS
ORAL_TABLET | ORAL | Status: AC
  Administered 2020-12-03: 1000 mg via ORAL
  Filled 2020-12-03: qty 2

## 2020-12-03 MED ORDER — ALUM & MAG HYDROXIDE-SIMETH 200-200-20 MG/5ML PO SUSP: 30.0000 mL | Freq: Four times a day (QID) | ORAL | Status: DC | PRN

## 2020-12-03 MED ORDER — ROCURONIUM BROMIDE 10 MG/ML (PF) SYRINGE
PREFILLED_SYRINGE | INTRAVENOUS | Status: DC | PRN
  Administered 2020-12-03: 20 mg via INTRAVENOUS
  Administered 2020-12-03: 50 mg via INTRAVENOUS

## 2020-12-03 MED ORDER — OXYCODONE HCL 5 MG PO TABS
ORAL_TABLET | ORAL | Status: AC
  Filled 2020-12-03: qty 1

## 2020-12-03 MED ORDER — SUGAMMADEX SODIUM 200 MG/2ML IV SOLN
INTRAVENOUS | Status: DC | PRN
  Administered 2020-12-03: 150 mg via INTRAVENOUS

## 2020-12-03 MED ORDER — CYCLOBENZAPRINE HCL 10 MG PO TABS
10.0000 mg | ORAL_TABLET | Freq: Every evening | ORAL | Status: DC | PRN
  Administered 2020-12-03: 10 mg via ORAL
  Filled 2020-12-03: qty 1

## 2020-12-03 MED ORDER — BUPIVACAINE HCL (PF) 0.5 % IJ SOLN
INTRAMUSCULAR | Status: DC | PRN
  Administered 2020-12-03: 5 mL
  Administered 2020-12-03: 20 mL

## 2020-12-03 MED ORDER — MIDAZOLAM HCL 2 MG/2ML IJ SOLN
INTRAMUSCULAR | Status: AC
  Filled 2020-12-03: qty 2

## 2020-12-03 MED ORDER — MIDAZOLAM HCL 2 MG/2ML IJ SOLN
INTRAMUSCULAR | Status: DC | PRN
  Administered 2020-12-03: 2 mg via INTRAVENOUS

## 2020-12-03 MED ORDER — PROPOFOL 10 MG/ML IV BOLUS
INTRAVENOUS | Status: DC | PRN
  Administered 2020-12-03: 100 mg via INTRAVENOUS

## 2020-12-03 MED ORDER — KETOROLAC TROMETHAMINE 15 MG/ML IJ SOLN
15.0000 mg | Freq: Four times a day (QID) | INTRAMUSCULAR | Status: AC
  Administered 2020-12-03 – 2020-12-04 (×4): 15 mg via INTRAVENOUS
  Filled 2020-12-03 (×4): qty 1

## 2020-12-03 MED ORDER — DEXAMETHASONE SODIUM PHOSPHATE 10 MG/ML IJ SOLN
INTRAMUSCULAR | Status: DC | PRN
  Administered 2020-12-03: 10 mg via INTRAVENOUS

## 2020-12-03 MED ORDER — CHLORHEXIDINE GLUCONATE 0.12 % MT SOLN
15.0000 mL | Freq: Once | OROMUCOSAL | Status: AC
Start: 2020-12-03 — End: 2020-12-03

## 2020-12-03 MED ORDER — THROMBIN 20000 UNITS EX SOLR
CUTANEOUS | Status: DC | PRN
  Administered 2020-12-03: 20 mL via TOPICAL

## 2020-12-03 MED ORDER — THROMBIN 5000 UNITS EX SOLR
OROMUCOSAL | Status: DC | PRN
  Administered 2020-12-03: 5 mL via TOPICAL

## 2020-12-03 MED ORDER — ONDANSETRON HCL 4 MG PO TABS: 4.0000 mg | ORAL_TABLET | Freq: Four times a day (QID) | ORAL | Status: DC | PRN

## 2020-12-03 MED ORDER — ONDANSETRON HCL 4 MG/2ML IJ SOLN: 4.0000 mg | Freq: Four times a day (QID) | INTRAMUSCULAR | Status: DC | PRN

## 2020-12-03 MED ORDER — CLONAZEPAM 0.5 MG PO TABS
0.5000 mg | ORAL_TABLET | Freq: Two times a day (BID) | ORAL | Status: DC | PRN
  Administered 2020-12-03: 0.5 mg via ORAL
  Filled 2020-12-03: qty 1

## 2020-12-03 MED ORDER — CEFAZOLIN SODIUM-DEXTROSE 2-4 GM/100ML-% IV SOLN
2.0000 g | INTRAVENOUS | Status: AC
  Administered 2020-12-03: 2 g via INTRAVENOUS

## 2020-12-03 MED ORDER — METHOCARBAMOL 500 MG PO TABS
500.0000 mg | ORAL_TABLET | Freq: Four times a day (QID) | ORAL | Status: DC | PRN
  Administered 2020-12-03 – 2020-12-04 (×3): 500 mg via ORAL
  Filled 2020-12-03 (×3): qty 1

## 2020-12-03 MED ORDER — ACETAMINOPHEN 500 MG PO TABS: 1000.0000 mg | ORAL_TABLET | Freq: Once | ORAL | Status: AC

## 2020-12-03 MED ORDER — ZOLPIDEM TARTRATE 5 MG PO TABS: 5.0000 mg | ORAL_TABLET | Freq: Every evening | ORAL | Status: DC | PRN

## 2020-12-03 MED ORDER — SODIUM CHLORIDE 0.9% FLUSH: 3.0000 mL | INTRAVENOUS | Status: DC | PRN

## 2020-12-03 MED ORDER — FENTANYL CITRATE (PF) 100 MCG/2ML IJ SOLN
INTRAMUSCULAR | Status: AC
  Filled 2020-12-03: qty 2

## 2020-12-03 MED ORDER — TOPIRAMATE 25 MG PO TABS
50.0000 mg | ORAL_TABLET | Freq: Every day | ORAL | Status: DC
  Administered 2020-12-03: 50 mg via ORAL
  Filled 2020-12-03: qty 2

## 2020-12-03 MED ORDER — BISACODYL 10 MG RE SUPP: 10.0000 mg | Freq: Every day | RECTAL | Status: DC | PRN

## 2020-12-03 MED ORDER — ACETAMINOPHEN 650 MG RE SUPP: 650.0000 mg | RECTAL | Status: DC | PRN

## 2020-12-03 MED ORDER — OXYCODONE HCL 5 MG PO TABS
15.0000 mg | ORAL_TABLET | ORAL | Status: DC | PRN
Start: 2020-12-03 — End: 2020-12-04

## 2020-12-03 MED ORDER — METHOCARBAMOL 1000 MG/10ML IJ SOLN
500.0000 mg | Freq: Four times a day (QID) | INTRAVENOUS | Status: DC | PRN
  Filled 2020-12-03: qty 5

## 2020-12-03 MED ORDER — LIDOCAINE-EPINEPHRINE 2 %-1:100000 IJ SOLN
INTRAMUSCULAR | Status: AC
  Filled 2020-12-03: qty 1

## 2020-12-03 MED ORDER — MENTHOL 3 MG MT LOZG: 1.0000 | LOZENGE | OROMUCOSAL | Status: DC | PRN

## 2020-12-03 MED ORDER — BUPIVACAINE HCL (PF) 0.5 % IJ SOLN
INTRAMUSCULAR | Status: AC
  Filled 2020-12-03: qty 30

## 2020-12-03 MED ORDER — PROMETHAZINE HCL 25 MG/ML IJ SOLN: 6.2500 mg | INTRAMUSCULAR | Status: DC | PRN

## 2020-12-03 MED ORDER — CHLORHEXIDINE GLUCONATE CLOTH 2 % EX PADS: 6.0000 | MEDICATED_PAD | Freq: Once | CUTANEOUS | Status: DC

## 2020-12-03 MED ORDER — ONDANSETRON HCL 4 MG/2ML IJ SOLN
INTRAMUSCULAR | Status: DC | PRN
  Administered 2020-12-03: 4 mg via INTRAVENOUS

## 2020-12-03 MED ORDER — DOCUSATE SODIUM 100 MG PO CAPS
100.0000 mg | ORAL_CAPSULE | Freq: Two times a day (BID) | ORAL | Status: DC
  Administered 2020-12-03 (×2): 100 mg via ORAL
  Filled 2020-12-03 (×2): qty 1

## 2020-12-03 MED ORDER — SODIUM CHLORIDE 0.9% FLUSH
3.0000 mL | Freq: Two times a day (BID) | INTRAVENOUS | Status: DC
  Administered 2020-12-03: 3 mL via INTRAVENOUS

## 2020-12-03 MED ORDER — PROPOFOL 10 MG/ML IV BOLUS
INTRAVENOUS | Status: AC
  Filled 2020-12-03: qty 20

## 2020-12-03 MED ORDER — 0.9 % SODIUM CHLORIDE (POUR BTL) OPTIME
TOPICAL | Status: DC | PRN
  Administered 2020-12-03: 1000 mL

## 2020-12-03 MED ORDER — SODIUM CHLORIDE 0.9 % IV SOLN: 250.0000 mL | INTRAVENOUS | Status: DC

## 2020-12-03 MED ORDER — FENTANYL CITRATE (PF) 250 MCG/5ML IJ SOLN
INTRAMUSCULAR | Status: DC | PRN
  Administered 2020-12-03 (×4): 25 ug via INTRAVENOUS
  Administered 2020-12-03: 50 ug via INTRAVENOUS
  Administered 2020-12-03 (×2): 25 ug via INTRAVENOUS
  Administered 2020-12-03: 50 ug via INTRAVENOUS

## 2020-12-03 MED ORDER — CEFAZOLIN SODIUM-DEXTROSE 2-4 GM/100ML-% IV SOLN
2.0000 g | Freq: Three times a day (TID) | INTRAVENOUS | Status: AC
Start: 2020-12-03 — End: 2020-12-03
  Administered 2020-12-03 (×2): 2 g via INTRAVENOUS
  Filled 2020-12-03 (×2): qty 100

## 2020-12-03 MED ORDER — OXYCODONE HCL 5 MG/5ML PO SOLN: 5.0000 mg | Freq: Once | ORAL | Status: AC | PRN

## 2020-12-03 MED ORDER — FLEET ENEMA 7-19 GM/118ML RE ENEM
1.0000 | ENEMA | Freq: Once | RECTAL | Status: DC | PRN
Start: 2020-12-03 — End: 2020-12-04

## 2020-12-03 MED ORDER — PHENOL 1.4 % MT LIQD: 1.0000 | OROMUCOSAL | Status: DC | PRN

## 2020-12-03 MED ORDER — TOPIRAMATE 100 MG PO TABS
100.0000 mg | ORAL_TABLET | Freq: Every day | ORAL | Status: DC
  Administered 2020-12-03: 100 mg via ORAL
  Filled 2020-12-03: qty 1

## 2020-12-03 MED ORDER — MIRTAZAPINE 15 MG PO TABS
15.0000 mg | ORAL_TABLET | Freq: Every day | ORAL | Status: DC
  Administered 2020-12-03: 15 mg via ORAL
  Filled 2020-12-03: qty 1

## 2020-12-03 MED ORDER — LORATADINE 10 MG PO TABS
10.0000 mg | ORAL_TABLET | Freq: Every day | ORAL | Status: DC
  Administered 2020-12-03: 10 mg via ORAL
  Filled 2020-12-03: qty 1

## 2020-12-03 MED ORDER — TRAMADOL HCL 50 MG PO TABS
50.0000 mg | ORAL_TABLET | Freq: Four times a day (QID) | ORAL | Status: DC | PRN
Start: 2020-12-03 — End: 2020-12-04

## 2020-12-03 MED ORDER — FENTANYL CITRATE (PF) 100 MCG/2ML IJ SOLN
25.0000 ug | INTRAMUSCULAR | Status: DC | PRN
  Administered 2020-12-03 (×3): 50 ug via INTRAVENOUS

## 2020-12-03 MED ORDER — TRIAMCINOLONE ACETONIDE 55 MCG/ACT NA AERO: 2.0000 | INHALATION_SPRAY | Freq: Every day | NASAL | Status: DC | PRN

## 2020-12-03 MED ORDER — LIDOCAINE-EPINEPHRINE 2 %-1:100000 IJ SOLN
INTRAMUSCULAR | Status: DC | PRN
  Administered 2020-12-03: 5 mL via INTRADERMAL

## 2020-12-03 MED ORDER — CEFAZOLIN SODIUM-DEXTROSE 2-4 GM/100ML-% IV SOLN
INTRAVENOUS | Status: AC
  Filled 2020-12-03: qty 100

## 2020-12-03 MED ORDER — ORAL CARE MOUTH RINSE: 15.0000 mL | Freq: Once | OROMUCOSAL | Status: AC

## 2020-12-03 MED ORDER — THROMBIN 5000 UNITS EX SOLR
CUTANEOUS | Status: AC
  Filled 2020-12-03: qty 5000

## 2020-12-03 MED ORDER — POLYETHYLENE GLYCOL 3350 17 G PO PACK: 17.0000 g | PACK | Freq: Every day | ORAL | Status: DC | PRN

## 2020-12-03 MED ORDER — FENTANYL CITRATE (PF) 250 MCG/5ML IJ SOLN
INTRAMUSCULAR | Status: AC
  Filled 2020-12-03: qty 5

## 2020-12-03 MED ORDER — LIDOCAINE 2% (20 MG/ML) 5 ML SYRINGE
INTRAMUSCULAR | Status: DC | PRN
  Administered 2020-12-03: 60 mg via INTRAVENOUS

## 2020-12-03 MED ORDER — THROMBIN 20000 UNITS EX SOLR
CUTANEOUS | Status: AC
  Filled 2020-12-03: qty 20000

## 2020-12-03 MED ORDER — SENNA 8.6 MG PO TABS
1.0000 | ORAL_TABLET | Freq: Two times a day (BID) | ORAL | Status: DC
  Administered 2020-12-03 (×2): 8.6 mg via ORAL
  Filled 2020-12-03 (×2): qty 1

## 2020-12-03 MED ORDER — ROSUVASTATIN CALCIUM 20 MG PO TABS
20.0000 mg | ORAL_TABLET | Freq: Every day | ORAL | Status: DC
  Administered 2020-12-03: 20 mg via ORAL
  Filled 2020-12-03: qty 1

## 2020-12-03 MED ORDER — PHENYLEPHRINE HCL-NACL 20-0.9 MG/250ML-% IV SOLN
INTRAVENOUS | Status: DC | PRN
  Administered 2020-12-03: 25 ug/min via INTRAVENOUS

## 2020-12-03 MED ORDER — OXYCODONE-ACETAMINOPHEN 5-325 MG PO TABS
1.0000 | ORAL_TABLET | ORAL | Status: DC | PRN
  Administered 2020-12-03 – 2020-12-04 (×5): 2 via ORAL
  Filled 2020-12-03 (×5): qty 2

## 2020-12-03 MED ORDER — ALBUTEROL SULFATE (2.5 MG/3ML) 0.083% IN NEBU: 3.0000 mL | INHALATION_SOLUTION | RESPIRATORY_TRACT | Status: DC | PRN

## 2020-12-03 MED ORDER — LACTATED RINGERS IV SOLN: INTRAVENOUS | Status: DC

## 2020-12-03 MED ORDER — ACETAMINOPHEN 325 MG PO TABS: 650.0000 mg | ORAL_TABLET | ORAL | Status: DC | PRN

## 2020-12-03 SURGICAL SUPPLY — 68 items
BAG COUNTER SPONGE SURGICOUNT (BAG) ×2 IMPLANT
BASKET BONE COLLECTION (BASKET) ×2 IMPLANT
BLADE CLIPPER SURG (BLADE) IMPLANT
BONE CANC CHIPS 20CC PCAN1/4 (Bone Implant) ×2 IMPLANT
BUR MATCHSTICK NEURO 3.0 LAGG (BURR) ×2 IMPLANT
CAGE COROENT MP 8X9X23M-8 SPIN (Cage) ×4 IMPLANT
CANISTER SUCT 3000ML PPV (MISCELLANEOUS) ×2 IMPLANT
CHIPS CANC BONE 20CC PCAN1/4 (Bone Implant) ×1 IMPLANT
CNTNR URN SCR LID CUP LEK RST (MISCELLANEOUS) ×1 IMPLANT
CONT SPEC 4OZ STRL OR WHT (MISCELLANEOUS) ×1
COVER BACK TABLE 60X90IN (DRAPES) ×2 IMPLANT
DECANTER SPIKE VIAL GLASS SM (MISCELLANEOUS) ×2 IMPLANT
DERMABOND ADVANCED (GAUZE/BANDAGES/DRESSINGS) ×1
DERMABOND ADVANCED .7 DNX12 (GAUZE/BANDAGES/DRESSINGS) ×1 IMPLANT
DEVICE DISSECT PLASMABLAD 3.0S (MISCELLANEOUS) ×1 IMPLANT
DRAPE C-ARM 42X72 X-RAY (DRAPES) ×2 IMPLANT
DRAPE C-ARMOR (DRAPES) ×2 IMPLANT
DRAPE HALF SHEET 40X57 (DRAPES) IMPLANT
DRAPE LAPAROTOMY 100X72X124 (DRAPES) ×2 IMPLANT
DURAPREP 26ML APPLICATOR (WOUND CARE) ×2 IMPLANT
DURASEAL APPLICATOR TIP (TIP) IMPLANT
DURASEAL SPINE SEALANT 3ML (MISCELLANEOUS) IMPLANT
ELECT REM PT RETURN 9FT ADLT (ELECTROSURGICAL) ×2
ELECTRODE REM PT RTRN 9FT ADLT (ELECTROSURGICAL) ×1 IMPLANT
GAUZE 4X4 16PLY ~~LOC~~+RFID DBL (SPONGE) ×2 IMPLANT
GAUZE SPONGE 4X4 12PLY STRL (GAUZE/BANDAGES/DRESSINGS) IMPLANT
GLOVE SRG 8 PF TXTR STRL LF DI (GLOVE) ×1 IMPLANT
GLOVE SURG LTX SZ8.5 (GLOVE) ×4 IMPLANT
GLOVE SURG POLY ORTHO LF SZ8 (GLOVE) ×4 IMPLANT
GLOVE SURG UNDER POLY LF SZ7.5 (GLOVE) ×4 IMPLANT
GLOVE SURG UNDER POLY LF SZ8 (GLOVE) ×1
GLOVE SURG UNDER POLY LF SZ8.5 (GLOVE) ×4 IMPLANT
GOWN STRL REUS W/ TWL LRG LVL3 (GOWN DISPOSABLE) IMPLANT
GOWN STRL REUS W/ TWL XL LVL3 (GOWN DISPOSABLE) ×2 IMPLANT
GOWN STRL REUS W/TWL 2XL LVL3 (GOWN DISPOSABLE) ×6 IMPLANT
GOWN STRL REUS W/TWL LRG LVL3 (GOWN DISPOSABLE)
GOWN STRL REUS W/TWL XL LVL3 (GOWN DISPOSABLE) ×2
GRAFT BONE PROTEIOS LRG 5CC (Orthopedic Implant) ×2 IMPLANT
HEMOSTAT POWDER KIT SURGIFOAM (HEMOSTASIS) ×2 IMPLANT
KIT BASIN OR (CUSTOM PROCEDURE TRAY) ×2 IMPLANT
KIT TURNOVER KIT B (KITS) ×2 IMPLANT
MILL MEDIUM DISP (BLADE) ×2 IMPLANT
NEEDLE FILTER BLUNT 18X 1/2SAF (NEEDLE) ×1
NEEDLE FILTER BLUNT 18X1 1/2 (NEEDLE) ×1 IMPLANT
NEEDLE HYPO 22GX1.5 SAFETY (NEEDLE) ×2 IMPLANT
NS IRRIG 1000ML POUR BTL (IV SOLUTION) ×2 IMPLANT
PACK LAMINECTOMY NEURO (CUSTOM PROCEDURE TRAY) ×2 IMPLANT
PAD ARMBOARD 7.5X6 YLW CONV (MISCELLANEOUS) ×4 IMPLANT
PATTIES SURGICAL .5 X1 (DISPOSABLE) ×2 IMPLANT
PLASMABLADE 3.0S (MISCELLANEOUS) ×2
ROD RELINE-O 5.5X100 LORD (Rod) ×4 IMPLANT
SCREW LOCK RELINE 5.5 TULIP (Screw) ×16 IMPLANT
SCREW RELINE-O POLY 5.5X45MM (Screw) ×2 IMPLANT
SCREW RELINE-O POLY 6.5X45 (Screw) ×2 IMPLANT
SPONGE SURGIFOAM ABS GEL 100 (HEMOSTASIS) ×2 IMPLANT
SPONGE T-LAP 4X18 ~~LOC~~+RFID (SPONGE) ×2 IMPLANT
SUT PROLENE 6 0 BV (SUTURE) IMPLANT
SUT VIC AB 1 CT1 18XBRD ANBCTR (SUTURE) ×1 IMPLANT
SUT VIC AB 1 CT1 8-18 (SUTURE) ×1
SUT VIC AB 2-0 CP2 18 (SUTURE) ×2 IMPLANT
SUT VIC AB 3-0 SH 8-18 (SUTURE) ×2 IMPLANT
SUT VIC AB 4-0 RB1 18 (SUTURE) ×2 IMPLANT
SYR 3ML LL SCALE MARK (SYRINGE) ×8 IMPLANT
SYR 5ML LL (SYRINGE) ×2 IMPLANT
TOWEL GREEN STERILE (TOWEL DISPOSABLE) ×2 IMPLANT
TOWEL GREEN STERILE FF (TOWEL DISPOSABLE) ×2 IMPLANT
TRAY FOLEY MTR SLVR 16FR STAT (SET/KITS/TRAYS/PACK) ×2 IMPLANT
WATER STERILE IRR 1000ML POUR (IV SOLUTION) ×2 IMPLANT

## 2020-12-03 NOTE — Op Note (Signed)
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  History of previous fusion L3-L5. Postoperative diagnosis: Same Procedure: Laminectomy L2-L3 with posterior decompression of L2 and L3 nerve roots.  More work than required for simple interbody technique.  Posterior lumbar interbody arthrodesis with peek spacers local autograft allograft and Proteus.  Pedicle screw fixation L2-L5.  Posterolateral arthrodesis with local autograft allograft and Proteus. Surgeon: Barnett Abu Anesthesia: General endotracheal Indications: The patient is a 55 year old individual who has had significant back pain with primarily a left lumbar radiculopathy.  She had developed severe degenerative changes with a retrolisthesis at L2-L3 above a previous L3-L5 fusion.  She had an extraforaminal disc herniation at the L2-3 level.  She is advised regarding surgery to decompress and stabilize the L2-3 joint.  Procedure: Patient was brought to the operating room supine on the stretcher.  After the smooth induction of general endotracheal anesthesia, she was carefully turned prone.  The back was prepped with alcohol DuraPrep and draped in sterile fashion.  Previously made midline incision was opened on the superior aspect and the incision was extended superiorly approximately 1 inch.  The dissection was then carried down to the lumbodorsal fascia and the fascia was opened on either side midline.  The for spinous process was noted to be mobile and was immediately above the fusion and dissection in the subperiosteal region identified the top of the old hardware at L3.  The L2-L3 interspace was then isolated with the dissection being carried out over the facet joint at L2-3 out to the transverse process of L2 this area was packed off for later use and grafting this dissection was done bilaterally.  Once the lamina was isolated laminotomies were created  removing the inferior margin lamina of L2 out to and including the entirety of the facet.  The previous hardware at L3 was then dissected down to the tops of the L5 pedicle screws.  This was packed off for later use and placing the rod.  Once the laminotomies were completed a careful dissection was performed to isolate the common dural tube the L2 nerve root superiorly and the L3 nerve roots inferiorly on the left side there was noted to be a substantial protrusion of the disc in the foraminal zone and the extraforaminal space that elevated and displaced the L2 nerve root.  With the disc space being isolated discectomy was performed using a 15 blade to open the disc space and remove a substantial quantity of disc material and a large subligamentous disc herniation in the foraminal zone on the left side.  This allowed for good decompression of the L2 nerve root.  A complete discectomy was performed using combination of curettes and rongeurs and a toothed curette to remove cartilaginous endplate from the endplates of L2 and L3.  Once the disc space was completely emptied it was sized for an appropriate size spacer it was felt that an 8 x 8 x 23 mm spacer with 8 degrees of lordosis would fit best a total of 12 cc of bone graft which was a combination of autograft allograft and 5 cc Proteus was placed into the disc space along with the 2 cages.  They are very placement was verified radiographically and pedicle entry sites were then chosen at L2.  The pedicle on the right side of L2 was noted to be rather diminutive but after probing it was felt that it would hold to 5.5 x 45 mm screw on  the left side was a 6.5 x 45 mm screw placed with fluoroscopic guidance.  Then the previous hardware was released and the rod was removed and a new rod measuring 100 mm in length was inserted in a neutral construct from L2-L5.  The lateral gutters were packed with the remaining autograft allograft and Proteus for a total volume of 9 cc  on each lateral gutter.  Once the bone was packed the hardware was tightened in a neutral position care was taken to make sure that the nerve roots were well decompressed and hemostasis was maintained final radiographs were obtained in AP and lateral projection and then closure was performed with #1 Vicryl in the lumbodorsal fascia 2-0 Vicryl in the subcutaneous tissues 3-0 Vicryl subcuticularly.  Dermabond was used on the skin.  Blood loss was estimated 250 cc.  25 cc of half percent Marcaine was injected into the paraspinous fascia.

## 2020-12-03 NOTE — Anesthesia Postprocedure Evaluation (Signed)
Anesthesia Post Note  Patient: Deborah Bates  Procedure(s) Performed: Lumbar two-three Posterior lumbar interbody fusion (Back)     Patient location during evaluation: PACU Anesthesia Type: General Level of consciousness: awake and alert Pain management: pain level controlled Vital Signs Assessment: post-procedure vital signs reviewed and stable Respiratory status: spontaneous breathing, nonlabored ventilation and respiratory function stable Cardiovascular status: stable and blood pressure returned to baseline Anesthetic complications: no   No notable events documented.  Last Vitals:  Vitals:   12/03/20 1300 12/03/20 1331  BP: 139/90 130/79  Pulse: 98 100  Resp: 18 18  Temp: (!) 36.2 C 36.8 C  SpO2: 98% 99%    Last Pain:  Vitals:   12/03/20 1331  TempSrc: Oral  PainSc:                  Beryle Lathe

## 2020-12-03 NOTE — Transfer of Care (Signed)
Immediate Anesthesia Transfer of Care Note  Patient: Deborah Bates  Procedure(s) Performed: Lumbar two-three Posterior lumbar interbody fusion (Back)  Patient Location: PACU  Anesthesia Type:General  Level of Consciousness: awake, alert  and oriented  Airway & Oxygen Therapy: Patient Spontanous Breathing  Post-op Assessment: Report given to RN and Post -op Vital signs reviewed and stable  Post vital signs: Reviewed and stable  Last Vitals:  Vitals Value Taken Time  BP 125/61 12/03/20 1200  Temp    Pulse 102 12/03/20 1203  Resp 16 12/03/20 1203  SpO2 95 % 12/03/20 1203  Vitals shown include unvalidated device data.  Last Pain:  Vitals:   12/03/20 0608  TempSrc:   PainSc: 6       Patients Stated Pain Goal: 4 (12/03/20 7035)  Complications: No notable events documented.

## 2020-12-03 NOTE — Anesthesia Procedure Notes (Signed)
Procedure Name: Intubation Date/Time: 12/03/2020 8:19 AM Performed by: Clearnce Sorrel, CRNA Pre-anesthesia Checklist: Patient identified, Emergency Drugs available, Suction available and Patient being monitored Patient Re-evaluated:Patient Re-evaluated prior to induction Oxygen Delivery Method: Circle System Utilized Preoxygenation: Pre-oxygenation with 100% oxygen Induction Type: IV induction Ventilation: Mask ventilation without difficulty Laryngoscope Size: Mac and 3 Grade View: Grade I Tube type: Oral Tube size: 7.0 mm Number of attempts: 1 Airway Equipment and Method: Stylet and Oral airway Placement Confirmation: ETT inserted through vocal cords under direct vision, positive ETCO2 and breath sounds checked- equal and bilateral Secured at: 22 cm Tube secured with: Tape Dental Injury: Teeth and Oropharynx as per pre-operative assessment

## 2020-12-03 NOTE — H&P (Signed)
Deborah Bates is an 55 y.o. female.   Chief Complaint: Back and bilateral leg pain HPI: Patient is a 55 year old individual who has had previous lumbar decompression and fusion at L3-4 and L4-5.  Deborah Bates is done well for a number of years surgery was in 2016 but now in the last year and a half has developed progressive pain and was found to have adjacent level disease at L2-3.  Patient is now being admitted to undergo surgical decompression and stabilization of the L2-3 with fixation to the previous hardware from L3-L5.  Past Medical History:  Diagnosis Date   Chronic lower back pain    Complication of anesthesia    TROUBLE COMING OUT OF IT    Dysrhythmia    TACHYCARDIA    Headache    "weekly" (09/30/2014)   Meningitis    1984   Neuromuscular disorder (HCC)    NUMBNESS TINGLING HAND/FEET   Tinnitus of both ears     Past Surgical History:  Procedure Laterality Date   ABDOMINAL HYSTERECTOMY     BACK SURGERY     CARPAL TUNNEL RELEASE Left 2012   EXCISIONAL HEMORRHOIDECTOMY     GANGLION CYST EXCISION Left 2012   INGUINAL HERNIA REPAIR Bilateral as an infant   LUMBAR DISC SURGERY  2012   POSTERIOR LUMBAR FUSION  09/30/2014    History reviewed. No pertinent family history. Social History:  reports that she quit smoking about 6 years ago. Her smoking use included cigarettes. She has a 17.00 pack-year smoking history. She has never used smokeless tobacco. She reports current alcohol use of about 6.0 standard drinks per week. She reports that she does not use drugs.  Allergies:  Allergies  Allergen Reactions   Dilaudid [Hydromorphone Hcl]     MAKES ME CRAZY    I CLAW MY SKIN OFF    Drug Ingredient [Diazepam]     Pt does not like it makes her feel    Morphine And Related Other (See Comments)    Pt states it "makes me crazy and makes me feel like I want to scratch my skin off"   Vicodin [Hydrocodone-Acetaminophen] Rash    Medications Prior to Admission  Medication Sig Dispense  Refill   cetirizine (ZYRTEC) 10 MG tablet Take 10 mg by mouth daily.     clonazePAM (KLONOPIN) 0.5 MG tablet Take 0.5 mg by mouth 2 (two) times daily as needed for anxiety.  1   cyclobenzaprine (FLEXERIL) 10 MG tablet Take 10 mg by mouth at bedtime as needed for muscle spasms.  2   estradiol (ESTRACE) 0.1 MG/GM vaginal cream Place 1 Applicatorful vaginally at bedtime.     eszopiclone (LUNESTA) 2 MG TABS tablet Take 2 mg by mouth at bedtime as needed for sleep.  3   mirtazapine (REMERON) 15 MG tablet Take 15 mg by mouth at bedtime.  2   rosuvastatin (CRESTOR) 20 MG tablet Take 20 mg by mouth daily.     topiramate (TOPAMAX) 100 MG tablet Take 50-100 mg by mouth See admin instructions. Take 50 mg by mouth in the morning and 100 mg at bedtime  4   triamcinolone (NASACORT) 55 MCG/ACT AERO nasal inhaler Place 2 sprays into the nose daily as needed (allergies).     dexamethasone (DECADRON) 1 MG tablet 2 tablets twice daily for 2 days, one tablet twice daily for 2 days, one tablet daily for 2 days. (Patient not taking: No sig reported) 14 tablet 0   oxyCODONE (ROXICODONE) 15 MG  immediate release tablet Take 1 tablet (15 mg total) by mouth every 4 (four) hours as needed for severe pain. (Patient not taking: No sig reported) 60 tablet 0   oxyCODONE-acetaminophen (PERCOCET) 7.5-325 MG tablet Take 1 tablet by mouth every 6 (six) hours as needed for severe pain.     oxyCODONE-acetaminophen (PERCOCET/ROXICET) 5-325 MG per tablet Take 1-2 tablets by mouth every 4 (four) hours as needed for moderate pain. (Patient not taking: Reported on 11/26/2020) 60 tablet 0   PROVENTIL HFA 108 (90 BASE) MCG/ACT inhaler Take 2 puffs by mouth every 4 (four) hours as needed for wheezing.   1   traMADol (ULTRAM) 50 MG tablet Take 50 mg by mouth every 6 (six) hours as needed for moderate pain.  2   valACYclovir (VALTREX) 1000 MG tablet Take 1 g by mouth as needed (cold sores).   5    Results for orders placed or performed during  the hospital encounter of 12/01/20 (from the past 48 hour(s))  Surgical pcr screen     Status: Abnormal   Collection Time: 12/01/20 12:59 PM   Specimen: Nasal Mucosa; Nasal Swab  Result Value Ref Range   MRSA, PCR NEGATIVE NEGATIVE   Staphylococcus aureus POSITIVE (A) NEGATIVE    Comment: (NOTE) The Xpert SA Assay (FDA approved for NASAL specimens in patients 52 years of age and older), is one component of a comprehensive surveillance program. It is not intended to diagnose infection nor to guide or monitor treatment. Performed at [facility], [address] [place], [place] [address]   SARS CORONAVIRUS 2 (TAT 6-24 HRS) Nasopharyngeal Nasopharyngeal Swab     Status: None   Collection Time: 12/01/20  1:17 PM   Specimen: Nasopharyngeal Swab  Result Value Ref Range   SARS Coronavirus 2 NEGATIVE NEGATIVE    Comment: (NOTE) SARS-CoV-2 target nucleic acids are NOT DETECTED.  The SARS-CoV-2 RNA is generally detectable in upper and lower respiratory specimens during the acute phase of infection. Negative results do not preclude SARS-CoV-2 infection, do not rule out co-infections with other pathogens, and should not be used as the sole basis for treatment or other patient management decisions. Negative results must be combined with clinical observations, patient history, and epidemiological information. The expected result is Negative.  Fact Sheet for Patients: HairSlick.no  Fact Sheet for Healthcare Providers: quierodirigir.com  This test is not yet approved or cleared by the [place] FDA and  has been authorized for detection and/or diagnosis of SARS-CoV-2 by FDA under an Emergency Use Authorization (EUA). This EUA will remain  in effect (meaning this test can be used) for the duration of the COVID-19 declaration under Se ction 564(b)(1) of the Act, 21 U.S.C. section 360bbb-3(b)(1), unless the authorization is  terminated or revoked sooner.  Performed at [facility], [address] [place], [place] [address]   CBC per protocol     Status: None   Collection Time: 12/01/20  1:17 PM  Result Value Ref Range   WBC 9.7 4.0 - 10.5 K/uL   RBC 4.52 3.87 - 5.11 MIL/uL   Hemoglobin 13.5 12.0 - 15.0 g/dL   HCT 62.9 52.8 - 41.3 %   MCV 92.3 80.0 - 100.0 fL   MCH 29.9 26.0 - 34.0 pg   MCHC 32.4 30.0 - 36.0 g/dL   RDW 24.4 01.0 - 27.2 %   Platelets 220 150 - 400 K/uL   nRBC 0.0 0.0 - 0.2 %    Comment: Performed  at [facility], [address], [place], [place] [address]  Type and screen     Status: None   Collection Time: 12/01/20  1:28 PM  Result Value Ref Range   ABO/RH(D) AB POS    Antibody Screen NEG    Sample Expiration 12/15/2020,2359    Extend sample reason      NO TRANSFUSIONS OR PREGNANCY IN THE PAST 3 MONTHS Performed at [facility], [address], [place], [place] [address]    No results found.  Review of Systems  Constitutional: Negative.   HENT: Negative.    Eyes: Negative.   Respiratory: Negative.    Cardiovascular: Negative.   Gastrointestinal: Negative.   Endocrine: Negative.   Genitourinary: Negative.   Musculoskeletal:  Positive for back pain and myalgias.  Neurological:  Positive for weakness.  Psychiatric/Behavioral: Negative.     Blood pressure 134/69, pulse 86, temperature 97.7 F (36.5 C), temperature source Oral, resp. rate 18, height 5\' 5"  (1.651 m), weight 68 kg, SpO2 96 %. Physical Exam Constitutional:      Appearance: Normal appearance.  HENT:     Head: Normocephalic and atraumatic.     Right Ear: Tympanic membrane normal.     Left Ear: Tympanic membrane normal.     Nose: Nose normal.     Mouth/Throat:     Mouth: Mucous membranes are moist.  Eyes:     Pupils: Pupils are equal, round, and reactive to light.  Cardiovascular:     Rate and Rhythm: Normal rate and regular rhythm.     Pulses: Normal pulses.  Pulmonary:     Effort:  Pulmonary effort is normal.     Breath sounds: Normal breath sounds.  Abdominal:     General: Abdomen is flat.     Palpations: Abdomen is soft.  Skin:    Capillary Refill: Capillary refill takes less than 2 seconds.  Neurological:     Mental Status: She is alert.     Comments: Proximal leg weakness in the iliopsoas and the quads bilaterally distal lower extremity strength is intact.  Straight leg raising is positive at 30 degrees in either lower extremity Patrick's maneuver is negative bilaterally.  Cranial nerve examination is normal  Psychiatric:        Mood and Affect: Mood normal.        Behavior: Behavior normal.        Thought Content: Thought content normal.        Judgment: Judgment normal.     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  Lumbar radiculopathy, neurogenic claudication.  Plan: Posterior decompression and fusion L2-3.  Fixation to adjacent hardware from L3-L5.  , MD 12/03/2020, 7:56 AM

## 2020-12-04 DIAGNOSIS — Z981 Arthrodesis status: Secondary | ICD-10-CM | POA: Diagnosis not present

## 2020-12-04 DIAGNOSIS — G8929 Other chronic pain: Secondary | ICD-10-CM | POA: Diagnosis present

## 2020-12-04 DIAGNOSIS — Z87891 Personal history of nicotine dependence: Secondary | ICD-10-CM | POA: Diagnosis not present

## 2020-12-04 DIAGNOSIS — Z90710 Acquired absence of both cervix and uterus: Secondary | ICD-10-CM | POA: Diagnosis not present

## 2020-12-04 DIAGNOSIS — M48062 Spinal stenosis, lumbar region with neurogenic claudication: Secondary | ICD-10-CM | POA: Diagnosis present

## 2020-12-04 DIAGNOSIS — M5416 Radiculopathy, lumbar region: Secondary | ICD-10-CM | POA: Diagnosis present

## 2020-12-04 DIAGNOSIS — Z20822 Contact with and (suspected) exposure to covid-19: Secondary | ICD-10-CM | POA: Diagnosis present

## 2020-12-04 DIAGNOSIS — M4316 Spondylolisthesis, lumbar region: Secondary | ICD-10-CM | POA: Diagnosis present

## 2020-12-04 DIAGNOSIS — Z8661 Personal history of infections of the central nervous system: Secondary | ICD-10-CM | POA: Diagnosis not present

## 2020-12-04 MED ORDER — KETOROLAC TROMETHAMINE 10 MG PO TABS: 10.0000 mg | ORAL_TABLET | Freq: Four times a day (QID) | ORAL | 0 refills | Status: AC | PRN

## 2020-12-04 NOTE — Discharge Summary (Signed)
Physician Discharge Summary  Patient ID: Deborah Bates MRN: [id number] DOB/AGE: [birth year] 55 y.o.  Admit date: 12/03/2020 Discharge date: 12/04/2020  Admission Diagnoses: Lumbar stenosis with neurogenic claudication L2-3.  History of arthrodesis L3 L5  Discharge Diagnoses: Lumbar stenosis with neurogenic claudication L2-L3.  Lumbar radiculopathy.  History of arthrodesis L3-L5. Active Problems:   Lumbar stenosis with neurogenic claudication   Discharged Condition: good  Hospital Course: Patient was admitted to undergo surgery which she tolerated well.  She is discharged home on oral pain medication.  Consults: None  Significant Diagnostic Studies: None  Treatments: surgery: See op note  Discharge Exam: Blood pressure 123/77, pulse 90, temperature 97.6 F (36.4 C), temperature source Oral, resp. rate 16, height 5\' 5"  (1.651 m), weight 68 kg, SpO2 100 %. Incision is clean and dry Station and gait are intact  Disposition: Discharge disposition: 01-Home or Self Care       Discharge Instructions     Call MD for:  redness, tenderness, or signs of infection (pain, swelling, redness, odor or green/yellow discharge around incision site)   Complete by: As directed    Call MD for:  severe uncontrolled pain   Complete by: As directed    Call MD for:  temperature >100.4   Complete by: As directed    Diet - low sodium heart healthy   Complete by: As directed    Discharge wound care:   Complete by: As directed    Okay to shower. Do not apply salves or appointments to incision. No heavy lifting with the upper extremities greater than 10 pounds. May resume driving when not requiring pain medication and patient feels comfortable with doing so.   Incentive spirometry RT   Complete by: As directed    Increase activity slowly   Complete by: As directed       Allergies as of 12/04/2020       Reactions   Dilaudid [hydromorphone Hcl]    MAKES ME CRAZY    I CLAW MY SKIN OFF     Drug Ingredient [diazepam]    Pt does not like it makes her feel    Morphine And Related Other (See Comments)   Pt states it "makes me crazy and makes me feel like I want to scratch my skin off"   Vicodin [hydrocodone-acetaminophen] Rash        Medication List     TAKE these medications    cetirizine 10 MG tablet Commonly known as: ZYRTEC Take 10 mg by mouth daily.   clonazePAM 0.5 MG tablet Commonly known as: KLONOPIN Take 0.5 mg by mouth 2 (two) times daily as needed for anxiety.   cyclobenzaprine 10 MG tablet Commonly known as: FLEXERIL Take 10 mg by mouth at bedtime as needed for muscle spasms.   dexamethasone 1 MG tablet Commonly known as: DECADRON 2 tablets twice daily for 2 days, one tablet twice daily for 2 days, one tablet daily for 2 days.   estradiol 0.1 MG/GM vaginal cream Commonly known as: ESTRACE Place 1 Applicatorful vaginally at bedtime.   eszopiclone 2 MG Tabs tablet Commonly known as: LUNESTA Take 2 mg by mouth at bedtime as needed for sleep.   ketorolac 10 MG tablet Commonly known as: TORADOL Take 1 tablet (10 mg total) by mouth every 6 (six) hours as needed for moderate pain or severe pain.   mirtazapine 15 MG tablet Commonly known as: REMERON Take 15 mg by mouth at bedtime.   oxyCODONE 15 MG immediate release  tablet Commonly known as: ROXICODONE Take 1 tablet (15 mg total) by mouth every 4 (four) hours as needed for severe pain.   oxyCODONE-acetaminophen 7.5-325 MG tablet Commonly known as: PERCOCET Take 1 tablet by mouth every 6 (six) hours as needed for severe pain.   oxyCODONE-acetaminophen 5-325 MG tablet Commonly known as: PERCOCET/ROXICET Take 1-2 tablets by mouth every 4 (four) hours as needed for moderate pain.   Proventil HFA 108 (90 Base) MCG/ACT inhaler Generic drug: albuterol Take 2 puffs by mouth every 4 (four) hours as needed for wheezing.   rosuvastatin 20 MG tablet Commonly known as: CRESTOR Take 20 mg by mouth  daily.   topiramate 100 MG tablet Commonly known as: TOPAMAX Take 50-100 mg by mouth See admin instructions. Take 50 mg by mouth in the morning and 100 mg at bedtime   traMADol 50 MG tablet Commonly known as: ULTRAM Take 50 mg by mouth every 6 (six) hours as needed for moderate pain.   triamcinolone 55 MCG/ACT Aero nasal inhaler Commonly known as: NASACORT Place 2 sprays into the nose daily as needed (allergies).   valACYclovir 1000 MG tablet Commonly known as: VALTREX Take 1 g by mouth as needed (cold sores).               Discharge Care Instructions  (From admission, onward)           Start     Ordered   12/04/20 0000  Discharge wound care:       Comments: Okay to shower. Do not apply salves or appointments to incision. No heavy lifting with the upper extremities greater than 10 pounds. May resume driving when not requiring pain medication and patient feels comfortable with doing so.   12/04/20 0820             Signed: Shary Key Deborah Bates 12/04/2020, 8:20 AM

## 2020-12-04 NOTE — Progress Notes (Signed)
Patient was transported with her back brace on via wheelchair by volunteer for discharge home; in no acute distress nor complaints of pain nor discomfort; honeycomb dressing on her back incision is clean, dry and intact; room was checked and accounted for all her belongings; discharge instructions concerning medications, wound care, precautions or when to call the doctor were all discussed with the patient and she verbalized understanding on the instructions given.

## 2020-12-04 NOTE — Evaluation (Signed)
Physical Therapy Evaluation Patient Details Name: Deborah Bates MRN: [id number] DOB: [birth year] Today's Date: 12/04/2020  History of Present Illness  Patient is a 55 y/o female who presents s/p L2-3 PLIF 12/03/20. PMH includes neuromuscular disorder, previous back surgeries.  Clinical Impression  Patient presents with post surgical deficits s/p back surgery. Pt lives at home with 2 dogs and reports being independent for ADLs/IADLs PTA. Pt tolerated transfers, gait training and stair training Mod I-independent without difficulty. Education re: back precautions, brace wearing, exercise recommendations, positioning, log roll technique and car transfer. Discussed safety when caring for dogs to help decrease fall risk. Pt is functioning close to baseline and does not require skilled therapy services. Discharge from therapy.       Recommendations for follow up therapy are one component of a multi-disciplinary discharge planning process, led by the attending physician.  Recommendations may be updated based on patient status, additional functional criteria and insurance authorization.  Follow Up Recommendations No PT follow up    Equipment Recommendations  None recommended by PT    Recommendations for Other Services       Precautions / Restrictions Precautions Precautions: Back Precaution Booklet Issued: Yes (comment) Precaution Comments: Reviewed handout and precautions Required Braces or Orthoses: Spinal Brace Spinal Brace: Lumbar corset;Applied in sitting position Restrictions Weight Bearing Restrictions: No      Mobility  Bed Mobility Overal bed mobility: Needs Assistance Bed Mobility: Rolling;Sidelying to Sit;Sit to Sidelying Rolling: Independent Sidelying to sit: Independent     Sit to sidelying: Independent General bed mobility comments: Up and walking the halls    Transfers Overall transfer level: Modified independent Equipment used: None             General  transfer comment: Stood from EOB without difficulty.  Ambulation/Gait Ambulation/Gait assistance: Independent Gait Distance (Feet): 400 Feet Assistive device: None Gait Pattern/deviations: Step-through pattern;Decreased stride length   Gait velocity interpretation: >2.62 ft/sec, indicative of community ambulatory General Gait Details: Steady gait with no evidence of imbalance.  Stairs Stairs: Yes Stairs assistance: Modified independent (Device/Increase time) Stair Management: One rail Left Number of Stairs: 13 General stair comments: Good use of rail, alternating pattern to ascend stairs.  Wheelchair Mobility    Modified Rankin (Stroke Patients Only)       Balance Overall balance assessment: No apparent balance deficits (not formally assessed)                                           Pertinent Vitals/Pain Pain Assessment: Faces Faces Pain Scale: Hurts little more Pain Descriptors / Indicators: Operative site guarding    Home Living Family/patient expects to be discharged to:: Private residence (Simultaneous filing. User may not have seen previous data.) Living Arrangements: Other (Comment) (Friend  Simultaneous filing. User may not have seen previous data.) Available Help at Discharge: Friend(s);Available PRN/intermittently (Simultaneous filing. User may not have seen previous data.) Type of Home: House (Simultaneous filing. User may not have seen previous data.)   Entrance Stairs-Rails: Right Entrance Stairs-Number of Steps: 5 Home Layout: Multi-level (Simultaneous filing. User may not have seen previous data.) Home Equipment: None      Prior Function Level of Independence: Independent         Comments: Works full time, has dogs.     Hand Dominance   Dominant Hand: Right    Extremity/Trunk Assessment   Upper Extremity Assessment  Upper Extremity Assessment: Overall WFL for tasks assessed    Lower Extremity Assessment Lower  Extremity Assessment: Defer to PT evaluation    Cervical / Trunk Assessment Cervical / Trunk Assessment: Other exceptions Cervical / Trunk Exceptions: s/p back surgery  Communication   Communication: No difficulties  Cognition Arousal/Alertness: Awake/alert Behavior During Therapy: WFL for tasks assessed/performed Overall Cognitive Status: Within Functional Limits for tasks assessed                                        General Comments General comments (skin integrity, edema, etc.): Able to doff socks without difficulty following back precautions.    Exercises     Assessment/Plan    PT Assessment Patent does not need any further PT services  PT Problem List         PT Treatment Interventions      PT Goals (Current goals can be found in the Care Plan section)  Acute Rehab PT Goals Patient Stated Goal: to go home PT Goal Formulation: All assessment and education complete, DC therapy    Frequency     Barriers to discharge        Co-evaluation               AM-PAC PT "6 Clicks" Mobility  Outcome Measure Help needed turning from your back to your side while in a flat bed without using bedrails?: None Help needed moving from lying on your back to sitting on the side of a flat bed without using bedrails?: None Help needed moving to and from a bed to a chair (including a wheelchair)?: None Help needed standing up from a chair using your arms (e.g., wheelchair or bedside chair)?: None Help needed to walk in hospital room?: None Help needed climbing 3-5 steps with a railing? : None 6 Click Score: 24    End of Session Equipment Utilized During Treatment: Back brace Activity Tolerance: Patient tolerated treatment well Patient left: in bed;with call bell/phone within reach Nurse Communication: Mobility status PT Visit Diagnosis: Difficulty in walking, not elsewhere classified (R26.2)    Time: 1610-9604 PT Time Calculation (min) (ACUTE ONLY): 17  min   Charges:   PT Evaluation $PT Eval Low Complexity: 1 Low          Vale Haven, PT, DPT Acute Rehabilitation Services Pager [phone] Office [phone]     Blake Divine A Tiffiany Beadles 12/04/2020, 9:02 AM

## 2020-12-04 NOTE — Evaluation (Signed)
Occupational Therapy Evaluation Patient Details Name: Deborah Bates MRN: [id number] DOB: 11-Nov-1965 Today's Date: 12/04/2020   History of Present Illness Patient is a 55 y/o female who presents s/p L2-3 PLIF 12/03/20. PMH includes neuromuscular disorder, previous back surgeries.   Clinical Impression   Patient admitted for the diagnosis and procedure above.  She lives alone, but will have some assist from friends for a few days.  Mild c/o pain post surgery, but is essentially at her baseline for ADL completion and in room mobility.  All questions answered, and no further acute OT needs.        Recommendations for follow up therapy are one component of a multi-disciplinary discharge planning process, led by the attending physician.  Recommendations may be updated based on patient status, additional functional criteria and insurance authorization.   Follow Up Recommendations  No OT follow up    Equipment Recommendations  None recommended by OT    Recommendations for Other Services       Precautions / Restrictions Precautions Precautions: Back Precaution Booklet Issued: Yes (comment) Precaution Comments: Reviewed handout and precautions Required Braces or Orthoses: Spinal Brace Spinal Brace: Lumbar corset;Applied in sitting position Restrictions Weight Bearing Restrictions: No      Mobility Bed Mobility Overal bed mobility: Needs Assistance Bed Mobility: Rolling;Sidelying to Sit;Sit to Sidelying Rolling: Independent Sidelying to sit: Independent     Sit to sidelying: Independent General bed mobility comments: Up and walking the halls Patient Response: Cooperative  Transfers Overall transfer level: Modified independent Equipment used: None             General transfer comment: Stood from EOB without difficulty.    Balance Overall balance assessment: No apparent balance deficits (not formally assessed)                                          ADL either performed or assessed with clinical judgement   ADL Overall ADL's : At baseline                                             Vision Baseline Vision/History: 1 Wears glasses Patient Visual Report: No change from baseline       Perception     Praxis      Pertinent Vitals/Pain Pain Assessment: Faces Faces Pain Scale: Hurts little more Pain Descriptors / Indicators: Operative site guarding     Hand Dominance Right   Extremity/Trunk Assessment Upper Extremity Assessment Upper Extremity Assessment: Overall WFL for tasks assessed   Lower Extremity Assessment Lower Extremity Assessment: Defer to PT evaluation   Cervical / Trunk Assessment Cervical / Trunk Assessment: Other exceptions Cervical / Trunk Exceptions: s/p back surgery   Communication Communication Communication: No difficulties   Cognition Arousal/Alertness: Awake/alert Behavior During Therapy: WFL for tasks assessed/performed Overall Cognitive Status: Within Functional Limits for tasks assessed                                     General Comments  Able to doff socks without difficulty following back precautions.    Exercises     Shoulder Instructions      Home Living Family/patient expects to be discharged to:: Private  residence (Simultaneous filing. User may not have seen previous data.) Living Arrangements: Other (Comment) (Friend  Simultaneous filing. User may not have seen previous data.) Available Help at Discharge: Friend(s);Available PRN/intermittently (Simultaneous filing. User may not have seen previous data.) Type of Home: House (Simultaneous filing. User may not have seen previous data.)   Entrance Stairs-Number of Steps: 5 Entrance Stairs-Rails: Right Home Layout: Multi-level (Simultaneous filing. User may not have seen previous data.)     Bathroom Shower/Tub: Tub/shower unit (Simultaneous filing. User may not have seen previous data.)    Bathroom Toilet: Standard (Simultaneous filing. User may not have seen previous data.) Bathroom Accessibility: Yes How Accessible: Accessible via walker Home Equipment: None          Prior Functioning/Environment Level of Independence: Independent        Comments: Works full time, has dogs.        OT Problem List: Pain      OT Treatment/Interventions:      OT Goals(Current goals can be found in the care plan section) Acute Rehab OT Goals Patient Stated Goal: Return home OT Goal Formulation: With patient Time For Goal Achievement: 12/04/20 Potential to Achieve Goals: Good  OT Frequency:     Barriers to D/C:  None noted          Co-evaluation              AM-PAC OT "6 Clicks" Daily Activity     Outcome Measure Help from another person eating meals?: None Help from another person taking care of personal grooming?: None Help from another person toileting, which includes using toliet, bedpan, or urinal?: None Help from another person bathing (including washing, rinsing, drying)?: None Help from another person to put on and taking off regular upper body clothing?: None Help from another person to put on and taking off regular lower body clothing?: None 6 Click Score: 24   End of Session Equipment Utilized During Treatment: Back brace Nurse Communication: Mobility status  Activity Tolerance: Patient tolerated treatment well Patient left: in bed;with call bell/phone within reach  OT Visit Diagnosis: Pain                Time: 0109-3235 OT Time Calculation (min): 20 min Charges:  OT General Charges $OT Visit: 1 Visit OT Evaluation $OT Eval Moderate Complexity: 1 Mod  12/04/2020  RP, OTR/L  Acute Rehabilitation Services  Office:  [phone]   Deborah Bates 12/04/2020, 9:03 AM

## 2020-12-04 NOTE — Plan of Care (Signed)

## 2022-05-01 IMAGING — RF DG LUMBAR SPINE 2-3V
1 series · 2 of 2 positions shown · non-contrast
Comparison: August 10, 2020.

CLINICAL DATA: Surgical posterior fusion of L2-3.

EXAM:
LUMBAR SPINE - 2-3 VIEW; DG C-ARM 1-60 MIN-NO REPORT
Radiation exposure index: 5.3244 mGy.

[Series 1: unknown protocol · 0.14mm/px · 2 of 2 slices shown]
[im 1/2]
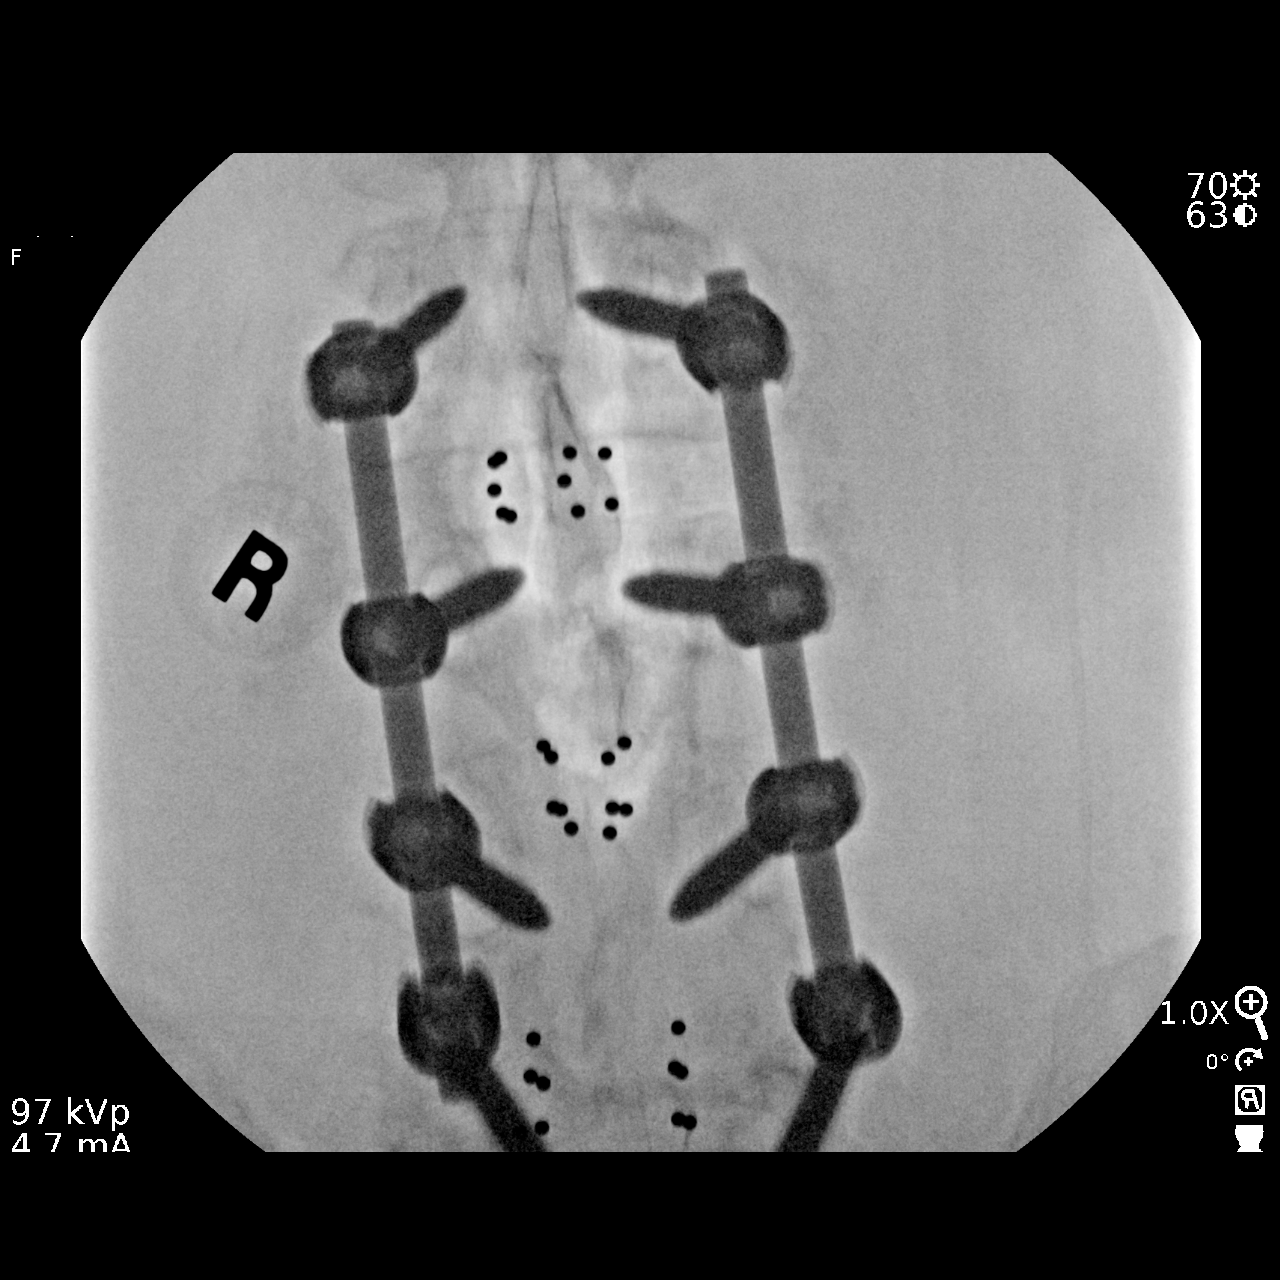
[im 2/2]
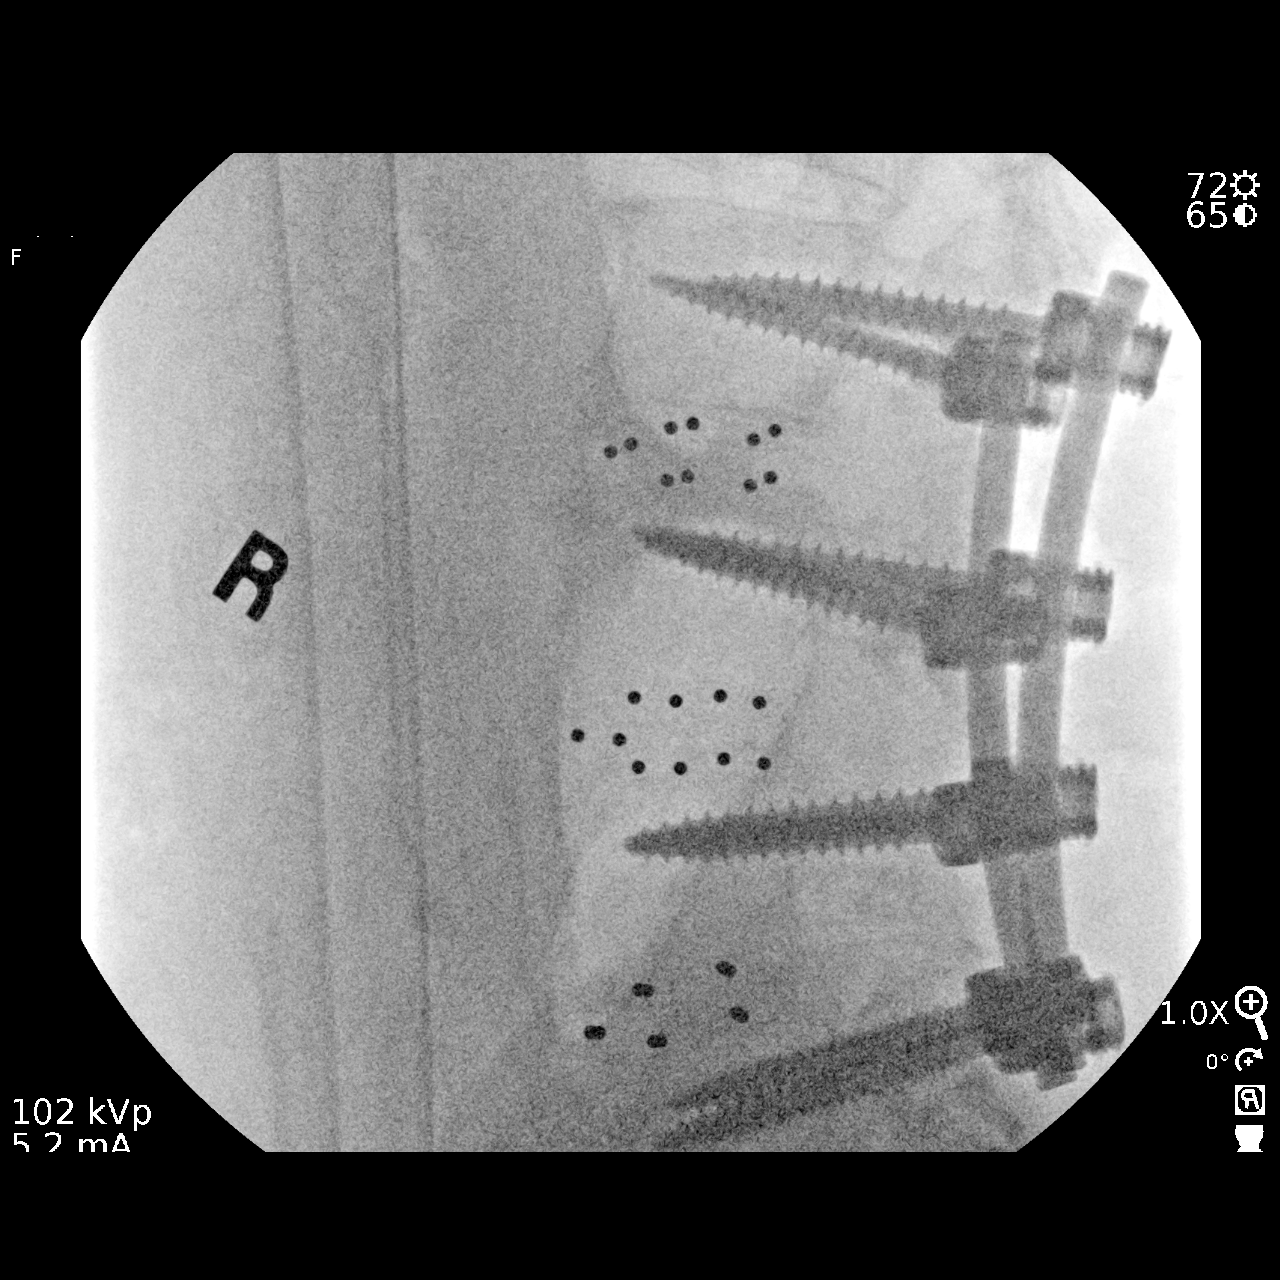

[2 of 2 positions shown; findings below may reference images not displayed]

FINDINGS: Two intraoperative fluoroscopic images demonstrate the patient be
status post interval posterior fusion of L2-3 with bilateral
intrapedicular screw placement and interbody fusion.
IMPRESSION: Fluoroscopic guidance provided during surgical posterior fusion of
L2-3.
